# Patient Record
Sex: Female | Born: 1995 | Race: Black or African American | Hispanic: No | Marital: Married | Smoking: Never smoker
Health system: Southern US, Community
[De-identification: ages and names within clinical notes are randomized; demographics above are authoritative.]

## PROBLEM LIST (undated history)

## (undated) HISTORY — PX: EYE SURGERY: SHX253

---
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
## 2017-08-18 ENCOUNTER — Other Ambulatory Visit: Payer: Self-pay | Admitting: Internal Medicine

## 2017-08-25 ENCOUNTER — Other Ambulatory Visit: Payer: Self-pay

## 2017-09-02 ENCOUNTER — Ambulatory Visit
Admission: RE | Admit: 2017-09-02 | Discharge: 2017-09-02 | Disposition: A | Discharge status: Home / Self Care | Payer: BLUE CROSS/BLUE SHIELD | Source: Ambulatory Visit | Attending: Internal Medicine | Admitting: Internal Medicine

## 2019-04-16 ENCOUNTER — Ambulatory Visit (INDEPENDENT_AMBULATORY_CARE_PROVIDER_SITE_OTHER): Payer: Self-pay | Admitting: Family Medicine

## 2019-04-16 ENCOUNTER — Encounter: Payer: Self-pay | Admitting: Family Medicine

## 2019-04-16 ENCOUNTER — Other Ambulatory Visit: Payer: Self-pay

## 2019-04-16 VITALS — BP 106/64 | HR 67 | Ht 63.0 in | Wt 112.0 lb

## 2019-04-16 DIAGNOSIS — L689 Hypertrichosis, unspecified: Secondary | ICD-10-CM

## 2019-04-16 DIAGNOSIS — Z23 Encounter for immunization: Secondary | ICD-10-CM

## 2019-04-16 DIAGNOSIS — L819 Disorder of pigmentation, unspecified: Secondary | ICD-10-CM

## 2019-04-16 DIAGNOSIS — Z0000 Encounter for general adult medical examination without abnormal findings: Secondary | ICD-10-CM

## 2019-04-16 DIAGNOSIS — L678 Other hair color and hair shaft abnormalities: Secondary | ICD-10-CM

## 2019-04-16 NOTE — Assessment & Plan Note (Signed)
Patient reports history of acne with dark spots remaining after resolution of comedones. Patient requests help with removing these spots.  -recommended hydroquinone -handout with information provided

## 2019-04-16 NOTE — Assessment & Plan Note (Signed)
Patient with excessive

## 2019-04-16 NOTE — Patient Instructions (Addendum)
It was a pleasure meeting you today. I look forward to working with you as your primary care physician.   Today we discussed:   1. Unwanted facial hair/dark spots       - Recommend drying over the counter hydroquinine      - Vaniqa that helps to slow hair growth on the face.       - Also recommend trying the wax/sugaring treatment to help with quick removal if you choose.    Please follow up with me in 4-6 weeks to see how things are improving.   Best Wishes,   Dr. Sharol Harness     Hydroquinone skin cream, gel, emulsion, lotion, or solution What is this medicine? HYDROQUINONE (hahy droh [facility]) is applied to the the skin to lighten areas that have darkened. Some products also contain sunscreens. This medicine may be used for other purposes; ask your health care provider or pharmacist if you have questions. COMMON BRAND NAME(S): Aclaro, Aclaro PD, Alera, Alphaquin HP, Alustra, Claripel, Complex B, Dermarest Skin Correcting Cream Plus, Eldopaque Forte, Eldoquin Forte, [employer]	[place], [place], [place], [place] XM, [place], [place], [place], Melanex, Melpaque HP, Melquin HP, Melquin-3, Nava-Jansen, Nuquin HP, Skin Bleaching, Solaquin Forte What should I tell my health care provider before I take this medicine? They need to know if you have any of these conditions:  an unusual or allergic reaction to hydroquinone, sunscreens, other medicines, foods, dyes, or preservatives  pregnant or trying to get pregnant  breast-feeding How should I use this medicine? This medicine is for external use only. Do not take by mouth. Follow the directions on the prescription label. Wash your hands before and after applying. Make sure the skin is clean and dry. Apply just enough to cover the affected area. Rub in gently but completely. Do not apply near the eyes, mouth, or other areas of sensitive skin. If accidental contact occurs, large amounts of water should be used to wash the affected area. If the eyes  are involved and eye irritation persists after thoroughly washing, contact your doctor. If you are using other skin medicines, apply them at different times of the day. Do not use your medicine more often than directed. Talk to your pediatrician regarding the use of this medicine in children. Special care may be needed. Overdosage: If you think you have taken too much of this medicine contact a poison control center or emergency room at once. NOTE: This medicine is only for you. Do not share this medicine with others. What if I miss a dose? If you miss a dose, apply it as soon as you can. If it is almost time for your next dose, use only that dose. Do not use double or extra doses. What may interact with this medicine?  benzoyl peroxide This list may not describe all possible interactions. Give your health care provider a list of all the medicines, herbs, non-prescription drugs, or dietary supplements you use. Also tell them if you smoke, drink alcohol, or use illegal drugs. Some items may interact with your medicine.  Eflornithine skin cream (Vaniqa) What is this medicine? EFLORNITHINE (ee FLOR ni theen) is used on the skin to reduce unwanted facial hair in women. This medicine may be used for other purposes; ask your health care provider or pharmacist if you have questions. COMMON BRAND NAME(S): Vaniqa What should I tell my health care provider before I take this medicine? They need to know if you have any of these conditions:  an unusual or  allergic reaction to eflornithine, other medicines, foods, dyes, or preservatives  pregnant or trying to get pregnant  breast-feeding How should I use this medicine? This medicine is for external use only. Do not take by mouth. Follow the direction on the prescription label. Wash your hands before and after use. Apply a thin film of cream to the affected areas of the face and chin twice a day (at least 8 hours apart), or as often as directed by your  doctor or health care professional. Rub the cream in thoroughly. Do not wash the treatment area for at least 4 hours after application of the cream. Wait a few minutes after applying this cream before you apply cosmetics or sunscreens. This cream does not permanently remove unwanted facial hair. Continue to use your normal method for hair removal until desired results have been achieved. Do not use your medicine more often than directed. Talk to your pediatrician regarding the use of this medicine in children. Special care may be needed. While this drug may be prescribed for girls as young as 24 years of age for selected conditions, precautions do apply. Overdosage: If you think you have taken too much of this medicine contact a poison control center or emergency room at once. NOTE: This medicine is only for you. Do not share this medicine with others. What if I miss a dose? If you miss a dose, use it as soon as you can. If it is almost time for your next dose, use only that dose. Do not use double or extra doses. What may interact with this medicine? Interactions are not expected. Do not use any other skin products on the affected area without asking your doctor or health care professional. This list may not describe all possible interactions. Give your health care provider a list of all the medicines, herbs, non-prescription drugs, or dietary supplements you use. Also tell them if you smoke, drink alcohol, or use illegal drugs. Some items may interact with your medicine. What should I watch for while using this medicine? Improvement of the condition occurs gradually. It may take 4 to 8 weeks. If there is no improvement after 6 months, talk to your doctor about stopping this medicine. About 8 weeks after stopping treatment with this medicine, the hair will return to the same condition as before treatment. What side effects may I notice from receiving this medicine? Side effects that you should report to  your doctor or health care professional as soon as possible:  allergic reactions like skin rash, itching or hives, swelling of the face, lips, or tongue Side effects that usually do not require medical attention (report to your doctor or health care professional if they continue or are bothersome):  hair bumps  redness  stinging or burning This list may not describe all possible side effects. Call your doctor for medical advice about side effects. You may report side effects to FDA at 1-800-FDA-1088. Where should I keep my medicine? Keep out of the reach of children. Store at room temperature between 15 and 30 degrees C (59 and 86 degrees F). Do not freeze. Throw away any unused medicine after the expiration date. NOTE: This sheet is a summary. It may not cover all possible information. If you have questions about this medicine, talk to your doctor, pharmacist, or health care provider.  2020 Elsevier/Gold Standard (2007-05-18 17:12:20) What should I watch for while using this medicine? Contact your doctor or health care professional if your condition does not improve  in the first two months or if you experience too much skin irritation. This medicine will work best if you avoid excessive exposure to sunlight and wear sunscreens and protective clothing. Some hydroquinone products contain sunscreens. Use a sunscreen (SPF 15 or higher). Do not use sun lamps or sun tanning beds or booths. Do not apply to sunburned areas or if you have a skin wound in the area of application. Most cosmetics, sunscreens, and moisturizing lotions may be worn over this medicine. Wait several minutes after application of this medicine before applying them.  What side effects may I notice from receiving this medicine? Side effects that you should report to your doctor or health care professional as soon as possible:  severe burning, itching, crusting, or swelling of the treated areas  unusual skin  discoloration Side effects that usually do not require medical attention (report to your doctor or health care professional if they continue or are bothersome):  mild itching or stinging  reddening of the skin This list may not describe all possible side effects. Call your doctor for medical advice about side effects. You may report side effects to FDA at 1-800-FDA-1088. Where should I keep my medicine? Keep out of the reach of children. Store at room temperature between 15 and 30 degrees C (59 and 86 degrees F). Do not freeze. Throw away any unused medicine after the expiration date. NOTE: This sheet is a summary. It may not cover all possible information. If you have questions about this medicine, talk to your doctor, pharmacist, or health care provider.  2020 Elsevier/Gold Standard (2015-02-15 20:45:28)

## 2019-04-16 NOTE — Assessment & Plan Note (Signed)
Patient presents with excessive facial hair and normal menstruation patterns, no other signs consistent with hirsutism. 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  -counseled patient on using cosmetic procedures for hair removal including waxing, sugaring and laser hair removal  -offered Vaniqa RX

## 2019-04-16 NOTE — Assessment & Plan Note (Signed)
Discussed recommendation for healthcare maintenance items including TDap and HIV screening.  -HIV  -TDap given

## 2019-04-16 NOTE — Progress Notes (Signed)
    SUBJECTIVE:   CHIEF COMPLAINT / HPI: establishing care  Family Excessive facial hair growth Anna Cohen having excessive growth of hair on her face, lower jaw, and neck.  Patient states that she tries to shave these areas with a hair overgrowth that decreased.  Patient question about menses parents and his regular menstrual cycles and BMI within normal range.  Patient states that her mother also has a growth in a similar pattern.  She reports a history of sensitive skin and is hesitant to have waxing procedures done on her face but there is currently received vaccine treatment.  She denies any other areas on her body with excessive hair growth.  She denies any fevers areas of hair growth called any itching or pain or bleeding or purulent drainage.  Dark spots on her abdomen Patient also reports having dark spots on her abdomen.  These spots coincide with the areas that she needs to shave frequently for hair removal purposes.  She has since been using there but does not notice any change to the dark pigmentation.  Patient is requesting something to help with dark spots on her skin.  PHQ9 and GAD7 completed. Score of 0.   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 Rhythm is regular. No murmurs or rubs.   Pulm: Clear to auscultation bilaterally, no crackles, wheezing, or diminished breath sounds. Normal respiratory effort Abdomen: Bowel sounds normal. Abdomen flat, soft and non-tender.  Extremities: No peripheral edema. Warm/ well perfused.   Neuro: alert and oriented  Skin: dark hairs along mandibles bilaterally, dark hairs on neck, hyperpigmented follicles in area of lower abdomen   ASSESSMENT/PLAN:   Hypertrichosis Patient with excessive   Abnormal facial hair Patient presents with excessive facial hair and normal menstruation patterns, no other signs consistent with hirsutism. 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  -counseled patient on using cosmetic procedures for hair removal including waxing, sugaring and laser hair removal  -offered Vaniqa RX    Hyperpigmentation of skin Patient reports history of acne with dark spots remaining after resolution of comedones. Patient requests help with removing these spots.  -recommended hydroquinone -handout with information provided   Healthcare maintenance Discussed recommendation for healthcare maintenance items including TDap and HIV screening.  -HIV  -TDap given     03/26/2019, MD [facility]

## 2019-04-17 LAB — HIV ANTIBODY (ROUTINE TESTING W REFLEX): HIV Screen 4th Generation wRfx: NONREACTIVE

## 2019-04-19 ENCOUNTER — Encounter: Payer: Self-pay | Admitting: Family Medicine

## 2019-04-19 NOTE — Progress Notes (Signed)
HIV screening results included in letter to be sent to patient.

## 2020-04-13 NOTE — Progress Notes (Signed)
    SUBJECTIVE:   CHIEF COMPLAINT / HPI: physical   HM  Patient recommended for pap smear. She denies hx of abnormal pap smears in the past. She denies vaginal odor, pruritis. LMP was Feburary around the 14, has had to take Plan B in the last month.  She does not use any form of regular contraception. She does not desire to start contraception and she is planning to return to abstinence. She has noticed some yellow and thin discharge. Patient reports having unprotected intercourse once, she would like to have STI testing today.    OBJECTIVE:   BP 115/70   Pulse 82   Wt 114 lb 9.6 oz (52 kg)   SpO2 100%   BMI 20.30 kg/m   General: female appearing stated age in no acute distress Cardio: Normal S1 and S2, no S3 or S4. Rhythm is regular.  Pulm: Clear to auscultation bilaterally, no crackles, wheezing, or diminished breath sounds. Normal respiratory effort Abdomen: Bowel sounds normal. Abdomen soft and non-tender.   Genitalia:  Normal introitus for age, no external lesions, yellow/green watery vaginal discharge, mucosa pink and moist, no vaginal or cervical lesions, no vaginal atrophy, no friaility or hemorrhage, normal uterus size and position, no adnexal masses or tenderness   ASSESSMENT/PLAN:   Healthcare maintenance Pap smear completed today  Hep C screening and HIV screening completed  Abnormal vaginal discharge so completed wet prep  Patient prefers RPR, G/C testing as well     Ronnald Ramp, MD [facility]

## 2020-04-14 ENCOUNTER — Other Ambulatory Visit (HOSPITAL_COMMUNITY)
Admission: RE | Admit: 2020-04-14 | Discharge: 2020-04-14 | Disposition: A | Discharge status: Home / Self Care | Payer: 59 | Source: Ambulatory Visit | Attending: Family Medicine | Admitting: Family Medicine

## 2020-04-14 ENCOUNTER — Other Ambulatory Visit: Payer: Self-pay

## 2020-04-14 ENCOUNTER — Ambulatory Visit (INDEPENDENT_AMBULATORY_CARE_PROVIDER_SITE_OTHER): Payer: 59 | Admitting: Family Medicine

## 2020-04-14 ENCOUNTER — Encounter: Payer: Self-pay | Admitting: Family Medicine

## 2020-04-14 VITALS — BP 115/70 | HR 82 | Wt 114.6 lb

## 2020-04-14 DIAGNOSIS — Z0000 Encounter for general adult medical examination without abnormal findings: Secondary | ICD-10-CM | POA: Diagnosis not present

## 2020-04-14 DIAGNOSIS — Z124 Encounter for screening for malignant neoplasm of cervix: Secondary | ICD-10-CM | POA: Insufficient documentation

## 2020-04-14 DIAGNOSIS — Z7251 High risk heterosexual behavior: Secondary | ICD-10-CM | POA: Diagnosis not present

## 2020-04-14 LAB — POCT WET PREP (WET MOUNT)
Clue Cells Wet Prep Whiff POC: NEGATIVE
Trichomonas Wet Prep HPF POC: ABSENT

## 2020-04-14 NOTE — Patient Instructions (Signed)
I will follow up with you regarding the results of your test today once they are available.  For the swollen areas near your groin, I recommend applying a warm cloth to help reduce swelling.    Ingrown Hair  An ingrown hair is a hair that curls and re-enters the skin instead of growing straight out of the skin. An ingrown hair can develop in any part of the skin that hair is removed from. An ingrown hair may cause small pockets of infection. What are the causes? An ingrown hair may be caused by:  Shaving.  Tweezing.  Waxing.  Using a hair removal cream. What increases the risk? You are more likely to develop this condition if you have curly hair. What are the signs or symptoms? Symptoms of an ingrown hair may include:  Small bumps on the skin. The bumps may be filled with pus.  Pain.  Itching. How is this diagnosed? An ingrown hair is diagnosed by a skin exam. How is this treated? Treatment is often not needed unless the ingrown hair has caused an infection. If needed, treatment may include:  Applying prescription creams to the skin. This can help with inflammation.  Applying warm compresses to the skin. This can help soften the skin.  Taking antibiotic medicine. An antibiotic may be prescribed if the infection is severe.  Retracting and releasing the ingrown hair tips. Follow these instructions at home: Medicines  Take, apply, or use over-the-counter and prescription medicines only as told by your health care provider. This includes any prescription creams.  If you were prescribed an antibiotic medicine, take it as told by your health care provider. Do not stop using the antibiotic even if you start to feel better. General instructions  Do not shave irritated areas of skin. You may start shaving these areas again once the irritation has gone away.  To help remove ingrown hairs on your face, you may use a facial sponge in a gentle circular motion.  Do not pick or  squeeze the irritated area of skin as this may cause infection and scarring. Managing pain and swelling  If directed, apply heat to the affected area as often as told by your health care provider. Use the heat source that your health care provider recommends, such as a moist heat pack or a heating pad. ? Place a towel between your skin and the heat source. ? Leave the heat on for 20-30 minutes. ? Remove the heat if your skin turns bright red. This is especially important if you are unable to feel pain, heat, or cold. You may have a greater risk of getting burned.   How is this prevented?  Shower before shaving.  Wrap areas that you are going to shave in warm, moist wraps for several minutes before shaving. The warmth and moisture help to soften the hairs and makes ingrown hairs less likely.  Use thick shaving gels.  Use a razor that cuts hair slightly above your skin, or use an electric shaver with a long shave setting.  Shave in the direction of hair growth.  Avoid making multiple razor strokes.  Apply a moisturizing lotion after shaving. Summary  An ingrown hair is a hair that curls and re-enters the skin instead of growing straight out of the skin.  Treatment is often not needed unless the ingrown hair has caused an infection.  Take, apply, or use over-the-counter and prescription medicines only as told by your health care provider. This includes any prescription creams.  Do not shave irritated areas of skin. You may start shaving these areas again once the irritation has gone away.  If directed, apply heat to the affected area. Use the heat source that your health care provider recommends, such as a moist heat pack or a heating pad. This information is not intended to replace advice given to you by your health care provider. Make sure you discuss any questions you have with your health care provider. Document Revised: 07/23/2017 Document Reviewed: 07/23/2017 Elsevier Patient  Education  2021 [employer].

## 2020-04-15 LAB — HIV ANTIBODY (ROUTINE TESTING W REFLEX): HIV Screen 4th Generation wRfx: NONREACTIVE

## 2020-04-15 LAB — RPR: RPR Ser Ql: NONREACTIVE

## 2020-04-15 LAB — HEPATITIS C ANTIBODY: Hep C Virus Ab: <0.1 s/co ratio (ref 0.0–0.9)

## 2020-04-16 NOTE — Assessment & Plan Note (Addendum)
Pap smear completed today  Hep C screening and HIV screening completed  Abnormal vaginal discharge so completed wet prep  Patient prefers RPR, G/C testing as well

## 2020-04-17 LAB — CYTOLOGY - PAP
Chlamydia: NEGATIVE
Comment: NEGATIVE
Comment: NORMAL
Diagnosis: NEGATIVE
Neisseria Gonorrhea: NEGATIVE

## 2020-07-31 IMAGING — US US ABDOMEN COMPLETE
1 series · 14 of 25 positions shown · non-contrast
Comparison: None in PACs

CLINICAL DATA: Elevated bilirubin.  Occasional back pain.

EXAM:
ABDOMEN ULTRASOUND COMPLETE

[Series 1: us abdomen complete · 0.11mm/px · 14 of 71 slices shown]
[im 1/71]
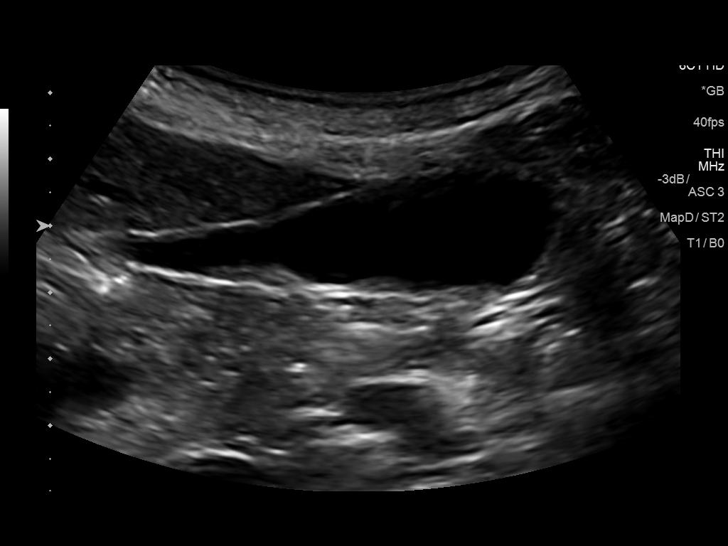
[im 6/71]
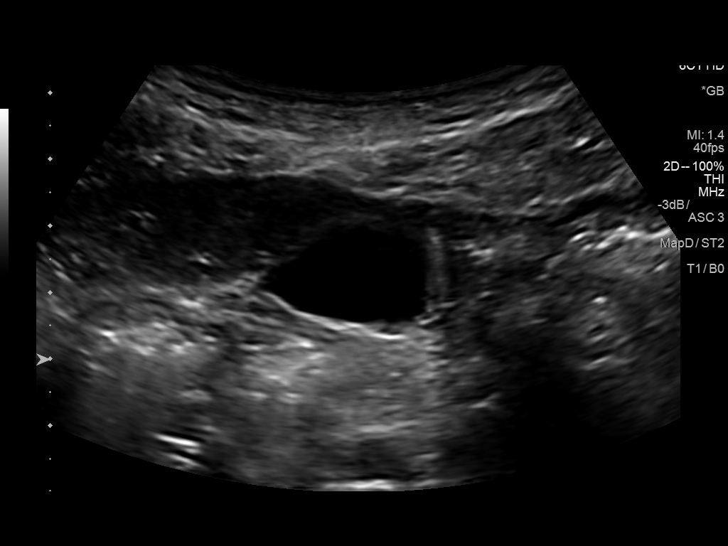
[im 12/71]
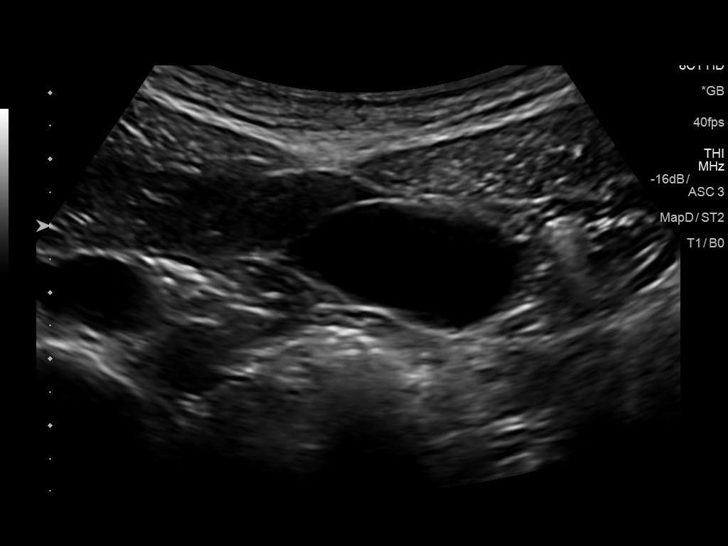
[im 18/71]
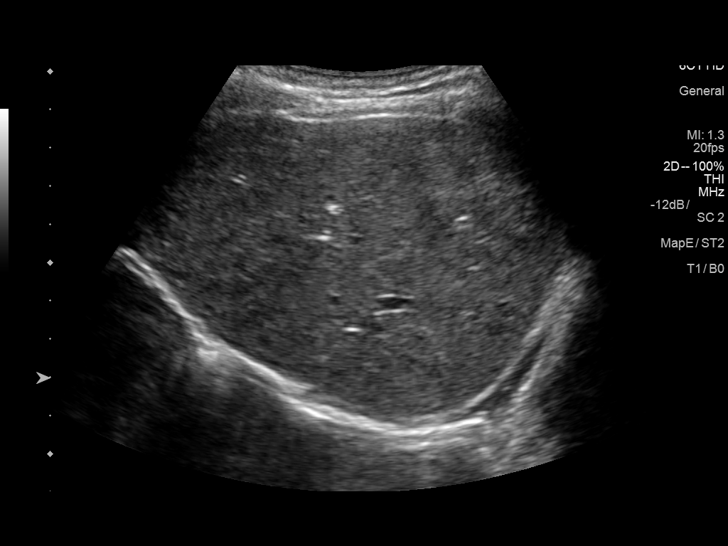
[im 24/71]
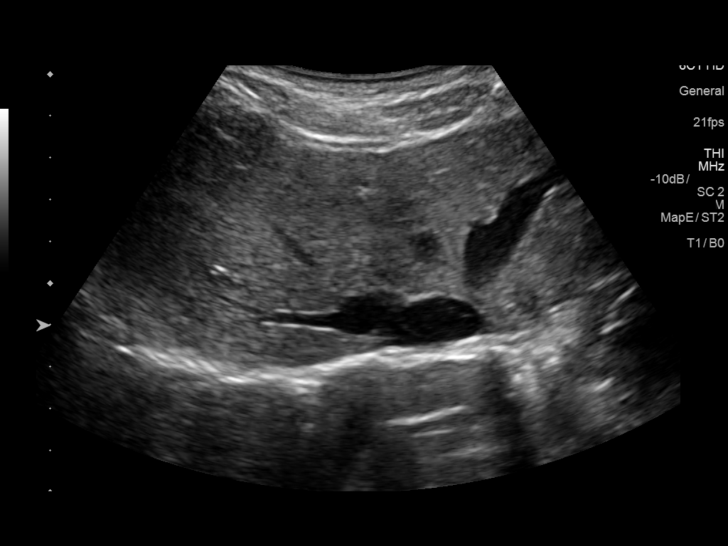
[im 27/71]
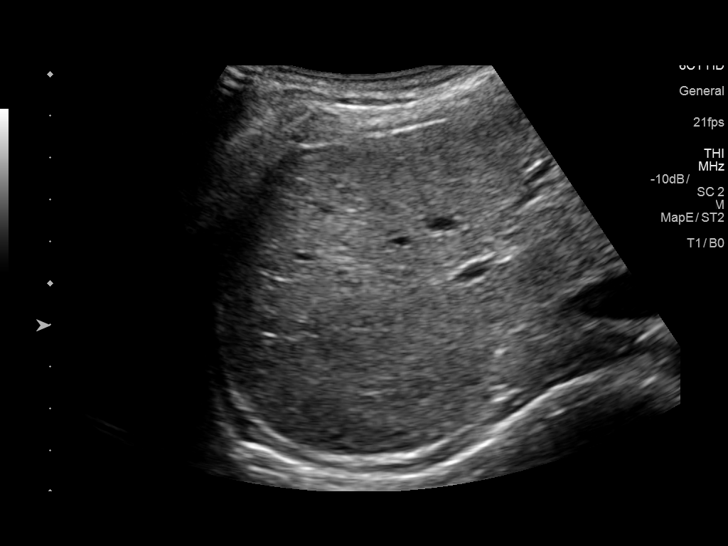
[im 33/71]
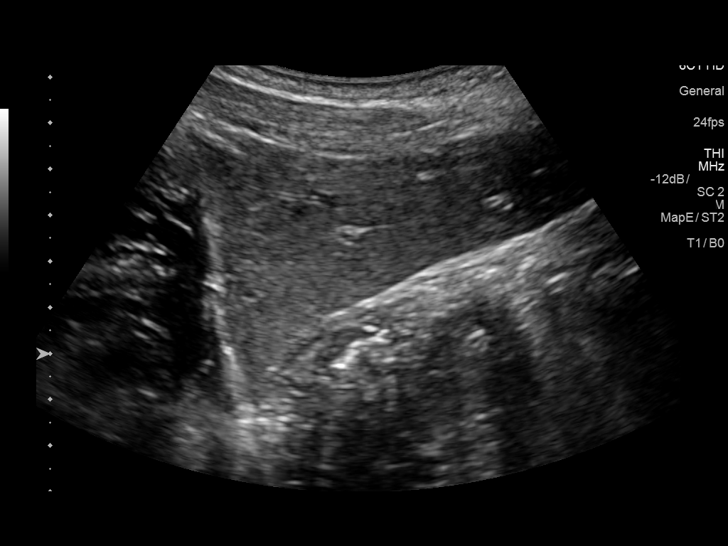
[im 38/71]
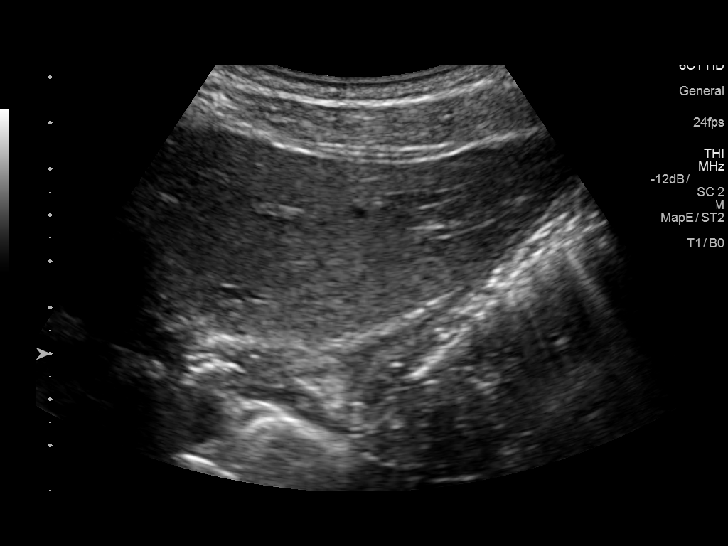
[im 44/71]
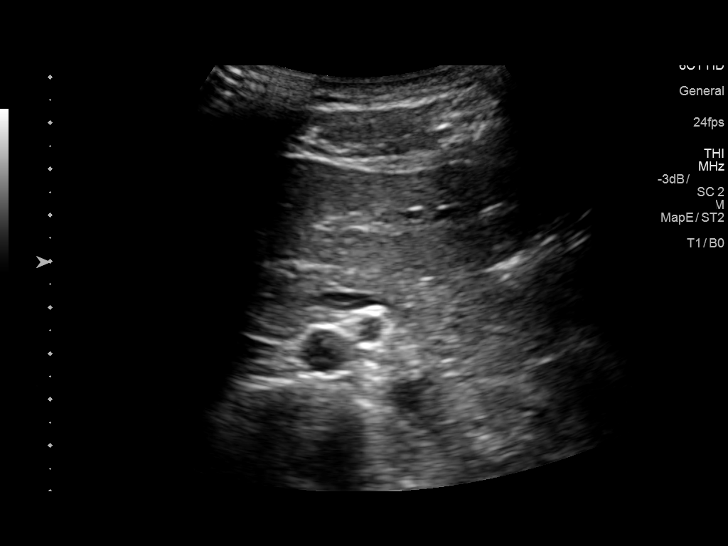
[im 47/71]
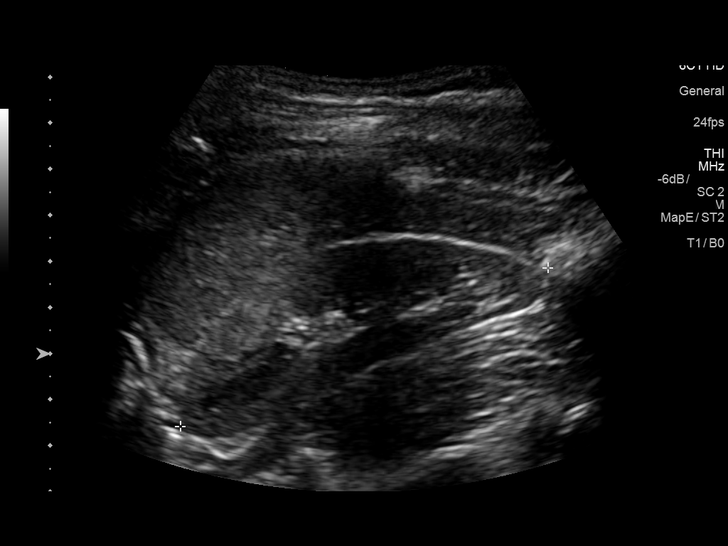
[im 53/71]
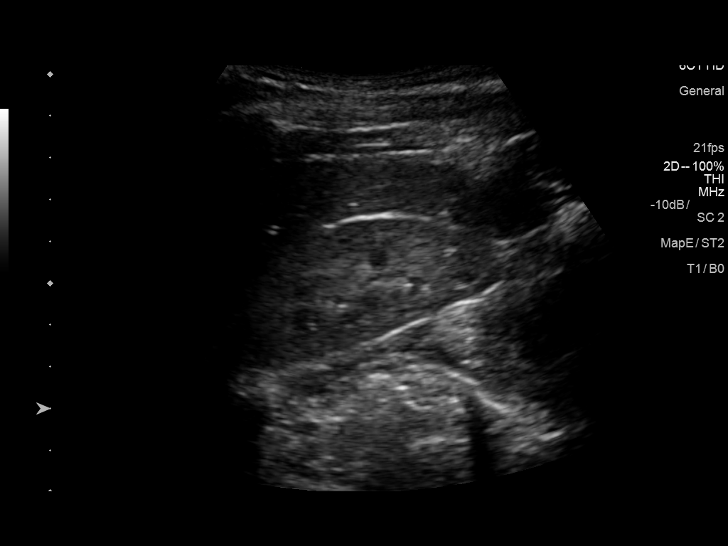
[im 59/71]
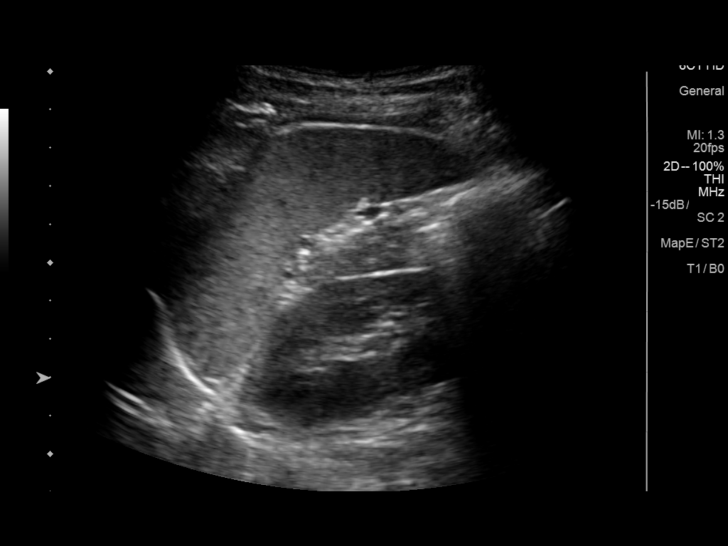
[im 65/71]
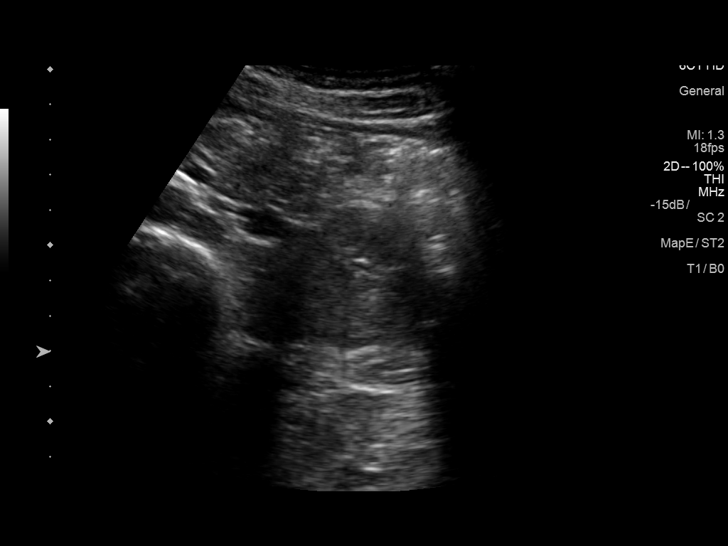
[im 71/71]
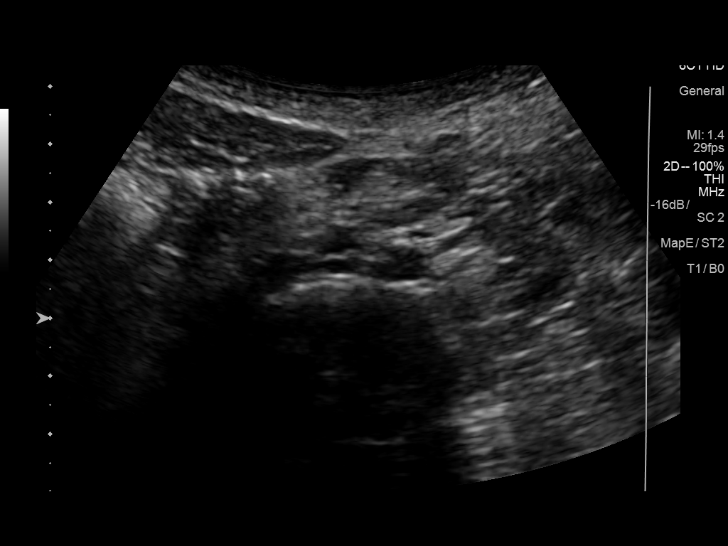

[14 of 25 positions shown; findings below may reference images not displayed]

FINDINGS: Gallbladder: No gallstones or wall thickening visualized. No
sonographic Murphy sign noted by sonographer.

Common bile duct: Diameter: 4 mm

Liver: No focal lesion identified. Within normal limits in
parenchymal echogenicity. Portal vein is patent on color Doppler
imaging with normal direction of blood flow towards the liver.

IVC: No abnormality visualized.

Pancreas: Visualized portion unremarkable.

Spleen: Size and appearance within normal limits.

Right Kidney: Length: 8.7 cm. The cortical echotexture is increased.
There is no focal mass nor hydronephrosis.

Left Kidney: Length: 8.4 cm. The cortical echotexture is increased.
There is no focal mass nor hydronephrosis.

Abdominal aorta: No aneurysm visualized.

Other findings: None.
IMPRESSION: Increased renal cortical echotexture compatible with medical renal
disease. No hydronephrosis.

Normal appearance of the gallbladder, common bile duct, and liver.

## 2020-12-13 ENCOUNTER — Other Ambulatory Visit: Payer: Self-pay

## 2020-12-13 ENCOUNTER — Other Ambulatory Visit: Payer: Self-pay | Admitting: Family Medicine

## 2020-12-13 ENCOUNTER — Encounter: Payer: Self-pay | Admitting: Family Medicine

## 2020-12-13 ENCOUNTER — Ambulatory Visit: Payer: 59 | Admitting: Family Medicine

## 2020-12-13 VITALS — BP 132/67 | HR 60 | Wt 119.0 lb

## 2020-12-13 DIAGNOSIS — Z7184 Encounter for health counseling related to travel: Secondary | ICD-10-CM | POA: Diagnosis not present

## 2020-12-13 DIAGNOSIS — Z23 Encounter for immunization: Secondary | ICD-10-CM | POA: Diagnosis not present

## 2020-12-13 DIAGNOSIS — Z298 Encounter for other specified prophylactic measures: Secondary | ICD-10-CM

## 2020-12-13 DIAGNOSIS — R1032 Left lower quadrant pain: Secondary | ICD-10-CM

## 2020-12-13 LAB — POCT UA - MICROSCOPIC ONLY

## 2020-12-13 LAB — POCT URINALYSIS DIP (MANUAL ENTRY)
Bilirubin, UA: NEGATIVE
Glucose, UA: NEGATIVE mg/dL
Ketones, POC UA: NEGATIVE mg/dL
Nitrite, UA: NEGATIVE
Protein Ur, POC: NEGATIVE mg/dL
Spec Grav, UA: 1.015 (ref 1.010–1.025)
Urobilinogen, UA: 0.2 E.U./dL
pH, UA: 7 (ref 5.0–8.0)

## 2020-12-13 LAB — POCT URINE PREGNANCY: Preg Test, Ur: NEGATIVE

## 2020-12-13 MED ORDER — TYPHOID VACCINE PO CPDR: 1.0000 | DELAYED_RELEASE_CAPSULE | ORAL | 0 refills | Status: DC

## 2020-12-13 MED ORDER — ATOVAQUONE-PROGUANIL HCL 250-100 MG PO TABS: 1.0000 | ORAL_TABLET | Freq: Every day | ORAL | 0 refills | Status: DC

## 2020-12-13 MED ORDER — CEPHALEXIN 500 MG PO CAPS: 500.0000 mg | ORAL_CAPSULE | Freq: Two times a day (BID) | ORAL | 0 refills | Status: AC

## 2020-12-13 NOTE — Patient Instructions (Addendum)
For your left lower quadrant pain, you do have some signs of a possible UTI, I am sending in a medication called Keflex which you will take twice daily for 5 days.  I am also sending out for a culture and if that comes back negative we can stop the antibiotics.  I am sending in for a pelvic ultrasound as well to make sure it is not related to a cyst or an issue with your ovary.  For your travel prophylaxis medications they go as follows: - Malarone (Atovaquone-Proguanil), which you take 1 to 2 days prior to leaving, once daily during your entire stay and once daily for 7 days after you come back. - Vivotif, this is an oral typhoid vaccine which she will take every other day for total of 4 doses. (Take once you are done with your antibiotics) - You will also need to get yellow fever vaccination and that will have to be done at the health department       Typhoid Vaccine, Live oral enteric-coated capsules What is this medication? TYPHOID ORAL VACCINE (TYE foid vax EEN) is used to prevent typhoid infection. The vaccine is recommended if you travel to parts of the world where typhoid is common. This medicine may be used for other purposes; ask your health care provider or pharmacist if you have questions. COMMON BRAND NAME(S): Vivotif ([employer]ax[employer] Vivotif Berna What should I tell my care team before I take this medication? They need to know if you have any of these conditions: active infection with fever cancer HIV or AIDS immune system problems low blood counts, like low white cell, platelet, or red cell counts recent or ongoing radiation therapy stomach or intestine problems an unusual or allergic reaction to vaccines, yeast, other medicines, foods, dyes, or preservatives pregnant or trying to get pregnant breast-feeding How should I use this medication? Take this medicine by mouth with a glass of water. Take this medicine on an empty stomach, at least 1 hour before food. Do not take  with food. Do not cut, crush or chew this medicine. A copy of Vaccine Information Statements will be given before each vaccination. Read this sheet carefully each time. The sheet may change frequently. Talk to your pediatrician regarding the use of this medicine in children. While this drug may be prescribed for children as young as 6 years for selected conditions, precautions do apply. Overdosage: If you think you have taken too much of this medicine contact a poison control center or emergency room at once. NOTE: This medicine is only for you. Do not share this medicine with others. What if I miss a dose? It is important not to miss your dose. Call your doctor or health care professional if you miss a dose. Four doses, given 2 days apart, are needed for protection. The last dose should be given at least 1 week before travel to allow the vaccine time to work. What may interact with this medication? Do not take this medicine with any of the following medications: certain antibiotics like sulfamethoxazole (SMZ) or other sulfonamides This medicine may also interact with the following medications: antimalarial drugs immune globulins medicines for organ transplant medicines to treat cancer other vaccines some medicines for arthritis steroid medicines like prednisone or cortisone This list may not describe all possible interactions. Give your health care provider a list of all the medicines, herbs, non-prescription drugs, or dietary supplements you use. Also tell them if you smoke, drink alcohol, or use illegal drugs.  Some items may interact with your medicine. What should I watch for while using this medication? This vaccine, like all vaccines, may not fully protect everyone. Report any side effects that are worrisome to your doctor right away. What side effects may I notice from receiving this medication? Side effects that you should report to your doctor or health care professional as soon as  possible: allergic reactions like skin rash, itching or hives, swelling of the face, lips, or tongue Side effects that usually do not require medical attention (report to your doctor or health care professional if they continue or are bothersome): diarrhea fever headache nausea, vomiting stomach pain This list may not describe all possible side effects. Call your doctor for medical advice about side effects. You may report side effects to FDA at 1-800-FDA-1088. Where should I keep my medication? Keep out of the reach of children. Store refrigerated between 2 and 8 degrees C (35.6 and 46.4 degrees F). Do not freeze. Keep this vaccine in the original container. NOTE: This sheet is a summary. It may not cover all possible information. If you have questions about this medicine, talk to your doctor, pharmacist, or health care provider.  2022 Elsevier/Gold Standard (2016-06-07 00:00:00)     Atovaquone; Proguanil tablets What is this medication? ATOVAQUONE; PROGUANIL (a TOE va kwone; pro GWAN il) is an antimalarial agent. It is used to prevent and to treat malaria infections. This medicine may be used for other purposes; ask your health care provider or pharmacist if you have questions. COMMON BRAND NAME(S): Malarone, Malarone Pediatric What should I tell my care team before I take this medication? They need to know if you have any of these conditions: kidney disease liver disease stomach problems an unusual or allergic reaction to atovaquone, proguanil, other medicines, foods, dyes, or preservatives pregnant or trying to get pregnant breast-feeding How should I use this medication? Take this medicine by mouth with a glass of water. Follow the directions on the prescription label. Take this medicine at the same time each day with food or a milky drink. The tablets may be crushed and mixed with condensed milk just before the dose. If you vomit within 1 hour after taking your dose, take your  dose again. Take all of your medicine as directed even if you think you are better. Do not skip doses or stop your medicine early. To prevent malaria, take this medicine daily starting 1 or 2 days before entering the area, and continue for 7 days after leaving. Talk to your pediatrician regarding the use of this medicine in children. While this drug may be prescribed for children for selected conditions, precautions do apply. Overdosage: If you think you have taken too much of this medicine contact a poison control center or emergency room at once. NOTE: This medicine is only for you. Do not share this medicine with others. What if I miss a dose? If you miss a dose, take it as soon as you can. If it is almost time for your next dose, take only that dose. Do not take double or extra doses. What may interact with this medication? metoclopramide rifabutin rifampin tetracycline This list may not describe all possible interactions. Give your health care provider a list of all the medicines, herbs, non-prescription drugs, or dietary supplements you use. Also tell them if you smoke, drink alcohol, or use illegal drugs. Some items may interact with your medicine. What should I watch for while using this medication? If you get a  fever during or after you are in a malaria-endemic area, call your doctor. Tell your doctor that you may have been exposed to malaria. This medicine can make you more sensitive to the sun. Keep out of the sun. If you cannot avoid being in the sun, wear protective clothing and use sunscreen. Do not use sun lamps or tanning beds/booths. While in areas where malaria is common, take steps to prevent mosquito bites. Stay in air-conditioned or well-screened rooms to reduce human-mosquito contact. Sleep under mosquito netting, preferably one with pyrethrum-containing insecticide. Wear long-sleeved shirts or blouses and long trousers to protect arms and legs. Apply mosquito repellents  containing DEET to uncovered areas of skin. Use a pyrethrum-containing flying insect spray to kill mosquitoes. What side effects may I notice from receiving this medication? Side effects that you should report to your doctor or health care professional as soon as possible: allergic reactions like skin rash, itching or hives, swelling of the face, lips, or tongue breathing problems changes in vision fever or infection redness, blistering, peeling or loosening of the skin, including inside the mouth unusually weak or tired Side effects that usually do not require medical attention (report to your doctor or health care professional if they continue or are bothersome): cough diarrhea dizziness headache loss of appetite nausea, vomiting stomach pain trouble sleeping This list may not describe all possible side effects. Call your doctor for medical advice about side effects. You may report side effects to FDA at 1-800-FDA-1088. Where should I keep my medication? Keep out of the reach of children. Store at room temperature between 15 and 30 degrees C (59 and 86 degrees F). Throw away any unused medicine after the expiration date. NOTE: This sheet is a summary. It may not cover all possible information. If you have questions about this medicine, talk to your doctor, pharmacist, or health care provider.  2022 Elsevier/Gold Standard (2020-02-11 00:00:00)

## 2020-12-13 NOTE — Progress Notes (Signed)
    SUBJECTIVE:   CHIEF COMPLAINT / HPI:   LLQ pain Patient reports that she has had LLQ pain for the last 6 days, she is not having any dysuria but does note that she has some pressure in her left lower quadrant when she is walking and when her bladder is full and she has the urge to urinate.  She has not any fevers or low back pain.  She is having normal bowel movements, does state that she had some harder stools about 2 days ago but not for constipation.  Last menstrual cycle was on October 22, she has not had intercourse since February and was STD tested afterwards.  Her periods typically last 5 days, are painful for the first several days and are not excessively heavy.  International travel Patient and her mother are reporting they are going to [place] on December 15 and staying for about 3 weeks.  They are wanting to have malarial prophylaxis and any other medications that are required.   PERTINENT  PMH / PSH: Reviewed  OBJECTIVE:   Wt 119 lb (54 kg)   LMP 11/18/2020 (Exact Date)   BMI 21.08 kg/m   Gen: well-appearing, NAD CV: RRR, no m/r/g appreciated, no peripheral edema Pulm: CTAB, no wheezes/crackles GI: soft, mildly tender in LLQ with a bit of fullness in the area, no rebound tenderness, non-distended  ASSESSMENT/PLAN:   LLQ Pain Patient with 6 days of intermittent LLQ pain/pressure, has not taken any medications. Not sexually active and declined STD testing. UA showed some trace blood and leukocytes, will send for culture and treat for UTI with Keflex BID x5 days. Consideration for ovarian pathology such as a cyst, will obtain a pelvic US, which was scheduled in the clinic. Consideration for an ovarian torsion but not likely with the intermittent pressure sensation, and this would be too far out for intervention.  International Travel Patient travelling to [place] in December for 3 weeks.  CDC guidelines were reviewed and the following medications were sent to  her pharmacy. - Malarone for antimalarial prophylaxis, which will be taken daily for 1 to 2 days before, every day while she is overseas in for 7 days after she comes back. - Typhoid vaccine, oral every other day x4 doses.  Patient instructed to start this after her antibiotic course. - Also needing yellow fever vaccination, she has obtained previously but unsure if it is still good.  We do not offer here and patient was instructed to call the health department.   Evelena Leyden, DO [facility]

## 2020-12-16 LAB — URINE CULTURE: Organism ID, Bacteria: NO GROWTH

## 2020-12-25 ENCOUNTER — Ambulatory Visit
Admission: RE | Admit: 2020-12-25 | Discharge: 2020-12-25 | Disposition: A | Discharge status: Home / Self Care | Payer: 59 | Source: Ambulatory Visit | Attending: Family Medicine | Admitting: Family Medicine

## 2020-12-25 ENCOUNTER — Other Ambulatory Visit: Payer: Self-pay

## 2020-12-25 DIAGNOSIS — R1032 Left lower quadrant pain: Secondary | ICD-10-CM

## 2021-01-05 ENCOUNTER — Telehealth: Payer: Self-pay

## 2021-01-05 MED ORDER — ATOVAQUONE-PROGUANIL HCL 250-100 MG PO TABS: 1.0000 | ORAL_TABLET | Freq: Every day | ORAL | 0 refills | Status: DC

## 2021-01-05 MED ORDER — TYPHOID VACCINE PO CPDR: 1.0000 | DELAYED_RELEASE_CAPSULE | ORAL | 0 refills | Status: DC

## 2021-01-05 NOTE — Addendum Note (Signed)
Addended by: Steva Colder on: 01/05/2021 02:47 PM   Modules accepted: Orders

## 2021-01-05 NOTE — Telephone Encounter (Addendum)
Patient calls nurse line reporting difficulty obtaining travel medications at [facility].   Patient reports medications are too expensive there. Patient reports she reached out to different pharmacies and [facility] has the best price.   Will resend medications to [facility].   Prescriptions have been canceled at [facility].

## 2021-02-03 ENCOUNTER — Other Ambulatory Visit: Payer: Self-pay

## 2021-02-03 ENCOUNTER — Encounter (HOSPITAL_COMMUNITY): Payer: Self-pay | Admitting: *Deleted

## 2021-02-03 ENCOUNTER — Ambulatory Visit (HOSPITAL_COMMUNITY)
Admission: EM | Admit: 2021-02-03 | Discharge: 2021-02-03 | Disposition: A | Discharge status: Home / Self Care | Payer: 59 | Attending: Physician Assistant | Admitting: Physician Assistant

## 2021-02-03 DIAGNOSIS — J069 Acute upper respiratory infection, unspecified: Secondary | ICD-10-CM | POA: Diagnosis present

## 2021-02-03 DIAGNOSIS — Z1152 Encounter for screening for COVID-19: Secondary | ICD-10-CM | POA: Diagnosis not present

## 2021-02-03 LAB — POC INFLUENZA A AND B ANTIGEN (URGENT CARE ONLY)
INFLUENZA A ANTIGEN, POC: NEGATIVE
INFLUENZA B ANTIGEN, POC: NEGATIVE

## 2021-02-03 NOTE — ED Provider Notes (Signed)
[facility]    CSN: [id number] Arrival date & time: 02/03/21  1527      History   Chief Complaint Chief Complaint  Patient presents with   Cough   Diarrhea    HPI Anna Cohen is a 26 y.o. female.   Patient here today for evaluation of cough, congestion, fever that started 2 days ago.  She states she has had a temperature up to 102.4.  Symptoms started right after she returned from [place].  She did have diarrhea that started today as well.  She has not had any nausea or vomiting.  She denies any sore throat.  She states she did have similar illness a week ago that cleared.  She has been using over-the-counter medications with mild relief.  The history is provided by the patient.   History reviewed. No pertinent past medical history.  Patient Active Problem List   Diagnosis Date Noted   Healthcare maintenance 04/16/2019   Abnormal facial hair 04/16/2019   Hyperpigmentation of skin 04/16/2019    Past Surgical History:  Procedure Laterality Date   EYE SURGERY      OB History   No obstetric history on file.      Home Medications    Prior to Admission medications   Medication Sig Start Date End Date Taking? Authorizing Provider  atovaquone-proguanil (MALARONE) 250-100 MG TABS tablet Take 1 tablet by mouth daily. Once daily 1 to 2 days prior to entering endemic area, during stay, and for 7 days after return 01/05/21  Yes Lilland, Alana, DO  typhoid (VIVOTIF) DR capsule Take 1 capsule by mouth every other day. 1 hour before a meal with a cold or luke-warm drink (not to exceed body temperature) on days 1, 3, 5, and 7, for total of 4 doses; complete at least 1 week prior to potential exposure 01/05/21  Yes Lilland, Alana, DO    Family History Family History  Problem Relation Age of Onset   Healthy Mother    Healthy Father     Social History Social History   Tobacco Use   Smoking status: Never   Smokeless tobacco: Never  Vaping Use   Vaping  Use: Never used  Substance Use Topics   Alcohol use: No   Drug use: Never     Allergies   Patient has no known allergies.   Review of Systems Review of Systems  Constitutional:  Negative for chills and fever.  HENT:  Positive for congestion, sinus pressure and sore throat. Negative for ear pain.   Eyes:  Negative for discharge and redness.  Respiratory:  Positive for cough. Negative for shortness of breath and wheezing.   Gastrointestinal:  Negative for abdominal pain, diarrhea, nausea and vomiting.    Physical Exam Triage Vital Signs ED Triage Vitals  Enc Vitals Group     BP      Pulse      Resp      Temp      Temp src      SpO2      Weight      Height      Head Circumference      Peak Flow      Pain Score      Pain Loc      Pain Edu?      Excl. in GC?    No data found.  Updated Vital Signs BP 123/74    Pulse 97    Temp 98.9 F (37.2  C) (Oral)    Resp 18    LMP 01/18/2021 (Exact Date)    SpO2 100%      Physical Exam Vitals and nursing note reviewed.  Constitutional:      General: She is not in acute distress.    Appearance: Normal appearance. She is not ill-appearing.  HENT:     Head: Normocephalic and atraumatic.     Right Ear: Tympanic membrane normal.     Left Ear: Tympanic membrane normal.     Nose: Congestion present.     Mouth/Throat:     Mouth: Mucous membranes are moist.     Pharynx: No oropharyngeal exudate or posterior oropharyngeal erythema.  Eyes:     Conjunctiva/sclera: Conjunctivae normal.  Cardiovascular:     Rate and Rhythm: Normal rate and regular rhythm.     Heart sounds: Normal heart sounds. No murmur heard. Pulmonary:     Effort: Pulmonary effort is normal. No respiratory distress.     Breath sounds: Normal breath sounds. No wheezing, rhonchi or rales.  Skin:    General: Skin is warm and dry.  Neurological:     Mental Status: She is alert.  Psychiatric:        Mood and Affect: Mood normal.        Thought Content: Thought  content normal.     UC Treatments / Results  Labs (all labs ordered are listed, but only abnormal results are displayed) Labs Reviewed  SARS CORONAVIRUS 2 (TAT 6-24 HRS)  POC INFLUENZA A AND B ANTIGEN (URGENT CARE ONLY)    EKG   Radiology No results found.  Procedures Procedures (including critical care time)  Medications Ordered in UC Medications - No data to display  Initial Impression / Assessment and Plan / UC Course  I have reviewed the triage vital signs and the nursing notes.  Pertinent labs & imaging results that were available during my care of the patient were reviewed by me and considered in my medical decision making (see chart for details).    Suspect viral etiology of symptoms. Covid screening ordered. Recommend symptomatic treatment. Follow up with any further concerns.   Final Clinical Impressions(s) / UC Diagnoses   Final diagnoses:  Viral upper respiratory tract infection  Encounter for screening for COVID-19   Discharge Instructions   None    ED Prescriptions   None    PDMP not reviewed this encounter.   Tomi Bamberger, PA-C 02/04/21 [phone]

## 2021-02-03 NOTE — ED Triage Notes (Signed)
C/O cough and congestion onset 12/31 w/ fevers, then improved. Returned 2 days ago from vacation from [place]. Started back with worsening cough and congestion and fevers up to 102.4, starting 2 days ago. Diarrhea onset yesterday.

## 2021-02-04 LAB — SARS CORONAVIRUS 2 (TAT 6-24 HRS): SARS Coronavirus 2: NEGATIVE

## 2021-02-07 ENCOUNTER — Other Ambulatory Visit: Payer: Self-pay

## 2021-02-07 ENCOUNTER — Ambulatory Visit (HOSPITAL_COMMUNITY)
Admission: EM | Admit: 2021-02-07 | Discharge: 2021-02-07 | Disposition: A | Discharge status: Home / Self Care | Payer: 59 | Attending: Emergency Medicine | Admitting: Emergency Medicine

## 2021-02-07 ENCOUNTER — Encounter (HOSPITAL_COMMUNITY): Payer: Self-pay | Admitting: Emergency Medicine

## 2021-02-07 DIAGNOSIS — J069 Acute upper respiratory infection, unspecified: Secondary | ICD-10-CM

## 2021-02-07 DIAGNOSIS — R197 Diarrhea, unspecified: Secondary | ICD-10-CM | POA: Diagnosis not present

## 2021-02-07 LAB — POCT URINALYSIS DIPSTICK, ED / UC
Bilirubin Urine: NEGATIVE
Glucose, UA: NEGATIVE mg/dL
Nitrite: NEGATIVE
Protein, ur: NEGATIVE mg/dL
Specific Gravity, Urine: 1.02 (ref 1.005–1.030)
Urobilinogen, UA: 0.2 mg/dL (ref 0.0–1.0)
pH: 6 (ref 5.0–8.0)

## 2021-02-07 LAB — POC URINE PREG, ED: Preg Test, Ur: NEGATIVE

## 2021-02-07 MED ORDER — AMOXICILLIN-POT CLAVULANATE 875-125 MG PO TABS: 1.0000 | ORAL_TABLET | Freq: Two times a day (BID) | ORAL | 0 refills | Status: DC

## 2021-02-07 NOTE — ED Provider Notes (Signed)
[facility]    CSN: [id number] Arrival date & time: 02/07/21  1141      History   Chief Complaint Chief Complaint  Patient presents with   Diarrhea   Emesis   Vaginal Bleeding    HPI Anna Cohen is a 26 y.o. female. Pt reports traveling to [place] 01/13/21. Returned to [place] 01/31/21.  Took Malarone during trip, finishing Malarone soon.   Developed sore throat 01/27/21, took amoxicllin for a few days, sore throat initially felt better, then returned along with nasal congestion, cough, fever. Last had fever 1/723, none since. Feels is getting worse as is now coughing up yellow sputum and blowing yellow nasal drainage from her nose. Reports post-nasal drainage. Denies SOB, wheezing. Cough and congestion worse at night. Feels dayquil and nyquil are not helping.   Has had diarrhea since 01/31/21.  Denies n/v.  Has not altered diet (ex had pork chop, broccoli, macaroni and cheese yesterday). Is having loose stools, not liquid, 3-4x per day after she eats. No diarrhea today as she has not eaten yet today. Denies abd pain except has abd cramping after eating and before diarrhea episode. Denies blood or mucus in stool. Denies increased gas, bloating.   Yesterday developed mild vaginal spotting with bright red blood. Is using panty liner. LMP 01/18/21. Took Plan B to prevent pregnancy ~01/25/21.   Pt seen at urgent care 1/7 for same - covid and influenza tests negative at that visit.      Diarrhea Associated symptoms: fever   Associated symptoms: no abdominal pain, no chills and no vomiting   Emesis Associated symptoms: cough, diarrhea and fever   Associated symptoms: no abdominal pain, no chills and no sore throat   Vaginal Bleeding Associated symptoms: fever   Associated symptoms: no abdominal pain, no dysuria, no nausea and no vaginal discharge    History reviewed. No pertinent past medical history.  Patient Active Problem List   Diagnosis Date Noted   Healthcare  maintenance 04/16/2019   Abnormal facial hair 04/16/2019   Hyperpigmentation of skin 04/16/2019    Past Surgical History:  Procedure Laterality Date   EYE SURGERY      OB History   No obstetric history on file.      Home Medications    Prior to Admission medications   Medication Sig Start Date End Date Taking? Authorizing Provider  amoxicillin-clavulanate (AUGMENTIN) 875-125 MG tablet Take 1 tablet by mouth every 12 (twelve) hours. 02/07/21  Yes Carvel Getting, NP  atovaquone-proguanil (MALARONE) 250-100 MG TABS tablet Take 1 tablet by mouth daily. Once daily 1 to 2 days prior to entering endemic area, during stay, and for 7 days after return 01/05/21   Lilland, Alana, DO  typhoid (VIVOTIF) DR capsule Take 1 capsule by mouth every other day. 1 hour before a meal with a cold or luke-warm drink (not to exceed body temperature) on days 1, 3, 5, and 7, for total of 4 doses; complete at least 1 week prior to potential exposure 01/05/21   Lilland, Lorrin Goodell, DO    Family History Family History  Problem Relation Age of Onset   Healthy Mother    Healthy Father     Social History Social History   Tobacco Use   Smoking status: Never   Smokeless tobacco: Never  Vaping Use   Vaping Use: Never used  Substance Use Topics   Alcohol use: No   Drug use: Never     Allergies   Patient  has no known allergies.   Review of Systems Review of Systems  Constitutional:  Positive for fever. Negative for appetite change and chills.  HENT:  Positive for congestion and postnasal drip. Negative for sinus pressure and sore throat.   Respiratory:  Positive for cough. Negative for shortness of breath and wheezing.   Gastrointestinal:  Positive for diarrhea. Negative for abdominal pain, nausea and vomiting.  Genitourinary:  Positive for vaginal bleeding. Negative for dysuria, hematuria and vaginal discharge.    Physical Exam Triage Vital Signs ED Triage Vitals  Enc Vitals Group     BP 02/07/21  1219 119/77     Pulse Rate 02/07/21 1219 70     Resp 02/07/21 1219 16     Temp 02/07/21 1219 98.6 F (37 C)     Temp Source 02/07/21 1219 Oral     SpO2 02/07/21 1219 99 %     Weight --      Height --      Head Circumference --      Peak Flow --      Pain Score 02/07/21 1353 8     Pain Loc --      Pain Edu? --      Excl. in GC? --    No data found.  Updated Vital Signs BP 119/77 (BP Location: Right Arm)    Pulse 70    Temp 98.6 F (37 C) (Oral)    Resp 16    LMP 01/18/2021 (Exact Date)    SpO2 99%   Visual Acuity Right Eye Distance:   Left Eye Distance:   Bilateral Distance:    Right Eye Near:   Left Eye Near:    Bilateral Near:     Physical Exam Constitutional:      General: She is not in acute distress.    Appearance: Normal appearance. She is ill-appearing.  HENT:     Right Ear: Tympanic membrane, ear canal and external ear normal.     Left Ear: Tympanic membrane, ear canal and external ear normal.     Nose: Congestion and rhinorrhea present.     Mouth/Throat:     Mouth: Mucous membranes are moist.     Pharynx: Oropharynx is clear.  Cardiovascular:     Rate and Rhythm: Normal rate and regular rhythm.  Pulmonary:     Effort: Pulmonary effort is normal.     Breath sounds: Normal breath sounds.  Abdominal:     General: Abdomen is flat. Bowel sounds are normal.     Palpations: Abdomen is soft.     Tenderness: There is no abdominal tenderness.  Neurological:     Mental Status: She is alert.     UC Treatments / Results  Labs (all labs ordered are listed, but only abnormal results are displayed) Labs Reviewed  POCT URINALYSIS DIPSTICK, ED / UC - Abnormal; Notable for the following components:      Result Value   Ketones, ur TRACE (*)    Hgb urine dipstick MODERATE (*)    Leukocytes,Ua TRACE (*)    All other components within normal limits  GASTROINTESTINAL PANEL BY PCR, STOOL (REPLACES STOOL CULTURE)  POC URINE PREG, ED    EKG   Radiology No  results found.  Procedures Procedures (including critical care time)  Medications Ordered in UC Medications - No data to display  Initial Impression / Assessment and Plan / UC Course  I have reviewed the triage vital signs and the nursing notes.  Pertinent  labs & imaging results that were available during my care of the patient were reviewed by me and considered in my medical decision making (see chart for details).    Discussed yellow nasal drainage - pt may have brewing sinus infection but I am hesitant to give her antibiotics given diarrhea. Pt to work on aggressive nasal saline rinse with Neti pot or saline nasal spray and add mucinex. If symptoms worsen/persist, may get augmentin rx filled. She understands the risk of worsening her diarrhea/causing C. Diff.   Pt to alter diet for diarrhea - given handout. If diarrhea persists through 02/11/21, pt to collect stool sample and bring in for testing. She likely has traveler's diarrhea.   UA shows mod hgb, urine preg is negative. I am not worried about vaginal spotting at this time. Pt f/u with pcp if spotting persists, usual period does not occur.   Final Clinical Impressions(s) / UC Diagnoses   Final diagnoses:  Diarrhea of presumed infectious origin  Viral upper respiratory tract infection     Discharge Instructions      Use saline nasal spray several times a day or saline irrigation such as with a Neti pot.  If you make sure old saline for Neti pot, use distilled water (not tap water) and either kosher salt or sea salt to make it (not table salt because it has iodine in it). The recipe is 1 cup warm distilled water mixed with 1/2 teaspoon salt. You can use a neti pot/saline irrigation several times a day as needed to help your nasal congestion drain.   Use Mucinex (generic guaifenesin is okay to use) to help thin your congestion and make it easier to drain. Drink lots of liquids to stay hydrated.   I have prescribed antibiotics  for a sinus infection but I advise you try to manage your congestion/mucus at home using saline irrigation and Mucinex before picking up the antibiotics as antibiotics will make your diarrhea worse.   Eat only simple, bland foods for the next few days to help relieve your diarrhea.   If your diarrhea continues through the weekend, please bring in a stool sample (using the supplies we give you).    ED Prescriptions     Medication Sig Dispense Auth. Provider   amoxicillin-clavulanate (AUGMENTIN) 875-125 MG tablet Take 1 tablet by mouth every 12 (twelve) hours. 14 tablet Carvel Getting, NP      PDMP not reviewed this encounter.   Carvel Getting, NP 02/07/21 1432

## 2021-02-07 NOTE — ED Triage Notes (Signed)
Pt reports was seen here recently and still having n/v/d.abd pains still ongoing when eats.  reports now having congestion and vaginal spotting that started yesterday. LMP 01/18/21. Blood is pinkish-reddish, wearing panty liner. Denies getting any medications when seen here.

## 2021-02-07 NOTE — Discharge Instructions (Addendum)
Use saline nasal spray several times a day or saline irrigation such as with a Neti pot.  If you make sure old saline for Neti pot, use distilled water (not tap water) and either kosher salt or sea salt to make it (not table salt because it has iodine in it). The recipe is 1 cup warm distilled water mixed with 1/2 teaspoon salt. You can use a neti pot/saline irrigation several times a day as needed to help your nasal congestion drain.   Use Mucinex (generic guaifenesin is okay to use) to help thin your congestion and make it easier to drain. Drink lots of liquids to stay hydrated.   I have prescribed antibiotics for a sinus infection but I advise you try to manage your congestion/mucus at home using saline irrigation and Mucinex before picking up the antibiotics as antibiotics will make your diarrhea worse.   Eat only simple, bland foods for the next few days to help relieve your diarrhea.   If your diarrhea continues through the weekend, please bring in a stool sample (using the supplies we give you).

## 2021-06-08 ENCOUNTER — Telehealth: Payer: Self-pay | Admitting: Family Medicine

## 2021-06-08 NOTE — Telephone Encounter (Signed)
Patient dropped off form at front desk for [facility].  Verified that patient section of form has been completed.  Last DOS/WCC with PCP was 12/13/20.  Placed form in red team folder to be completed by clinical staff.  Anna Cohen  ?

## 2021-06-08 NOTE — Telephone Encounter (Signed)
Reviewed form and placed in PCP's box for completion.  .Giavanna Kang R Porche Steinberger, CMA  

## 2021-06-12 NOTE — Telephone Encounter (Signed)
Form completed and placed in front office side pocket for patient pick up.  ? ?Eulis Foster, MD ?[place], PGY-3 ?[phone]  ? ?

## 2021-06-13 NOTE — Telephone Encounter (Signed)
Form placed up front for pick up.  ? ?Copy made for batch scanning.  ? ?VM left informing patient.  ?

## 2021-06-26 ENCOUNTER — Telehealth: Payer: 59

## 2021-06-26 ENCOUNTER — Ambulatory Visit (HOSPITAL_COMMUNITY)
Admission: EM | Admit: 2021-06-26 | Discharge: 2021-06-26 | Disposition: A | Discharge status: Home / Self Care | Payer: 59

## 2021-06-26 ENCOUNTER — Encounter (HOSPITAL_COMMUNITY): Payer: Self-pay | Admitting: Emergency Medicine

## 2021-06-26 DIAGNOSIS — M25562 Pain in left knee: Secondary | ICD-10-CM

## 2021-06-26 NOTE — ED Triage Notes (Signed)
Pt reports right eye irritation after getting "intimate body wash" in eye. States this occurred around 6 am and eye is still burning.  Pt also reports left knee pain since last Tuesday. 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 States have been using ACE wrap and muscle rub for relief.

## 2021-06-26 NOTE — ED Provider Notes (Signed)
[facility]    CSN: [id number] Arrival date & time: 06/26/21  2876     History   Chief Complaint Chief Complaint  Patient presents with   Eye Irritation    Knee Pain    HPI Anna Cohen is a 26 y.o. female.  Presents after getting soap in her eye.  States she rinsed and flushed it out after this occurred.  Has some minor irritation of the right eye, no redness itching or pain.  No drainage.  Has not tried anything for symptoms.  She does wear contacts. 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  She had some pain and swelling when the incident occurred but was able to walk.  She tried a couple ibuprofen the day of the incident but has not used any medicine since.  She has been applying Ace wrap bandage.  Has tried ice on and off for the first couple days.  States symptoms are much improved but she still has some minor pain in the lateral knee. Denies fever, chills, abdominal pain, vomiting/diarrhea, back pain, weakness, numbness/tingling.  She has full range of motion of the left leg.   History reviewed. No pertinent past medical history.  Patient Active Problem List   Diagnosis Date Noted   Healthcare maintenance 04/16/2019   Abnormal facial hair 04/16/2019   Hyperpigmentation of skin 04/16/2019    Past Surgical History:  Procedure Laterality Date   EYE SURGERY      OB History   No obstetric history on file.      Home Medications    Prior to Admission medications   Medication Sig Start Date End Date Taking? Authorizing Provider  amoxicillin-clavulanate (AUGMENTIN) 875-125 MG tablet Take 1 tablet by mouth every 12 (twelve) hours. 02/07/21   Cathlyn Parsons, NP  atovaquone-proguanil (MALARONE) 250-100 MG TABS tablet Take 1 tablet by mouth daily. Once daily 1 to 2 days prior to entering endemic area, during stay, and for 7 days after return 01/05/21   Lilland, Alana, DO  typhoid (VIVOTIF) DR capsule Take 1 capsule by mouth every  other day. 1 hour before a meal with a cold or luke-warm drink (not to exceed body temperature) on days 1, 3, 5, and 7, for total of 4 doses; complete at least 1 week prior to potential exposure 01/05/21   Lilland, Percival Spanish, DO    Family History Family History  Problem Relation Age of Onset   Healthy Mother    Healthy Father     Social History Social History   Tobacco Use   Smoking status: Never   Smokeless tobacco: Never  Vaping Use   Vaping Use: Never used  Substance Use Topics   Alcohol use: No   Drug use: Never     Allergies   Patient has no known allergies.   Review of Systems Review of Systems As per HPI  Physical Exam Triage Vital Signs ED Triage Vitals  Enc Vitals Group     BP 06/26/21 0947 109/71     Pulse Rate 06/26/21 0947 60     Resp 06/26/21 0947 16     Temp 06/26/21 0947 (!) 97.4 F (36.3 C)     Temp Source 06/26/21 0947 Oral     SpO2 06/26/21 0947 100 %     Weight --      Height --      Head Circumference --      Peak Flow --  Pain Score 06/26/21 0946 5     Pain Loc --      Pain Edu? --      Excl. in GC? --    No data found.  Updated Vital Signs BP 109/71 (BP Location: Left Arm)   Pulse 60   Temp (!) 97.4 F (36.3 C) (Oral)   Resp 16   SpO2 100%     Physical Exam Vitals and nursing note reviewed.  Constitutional:      General: She is not in acute distress. HENT:     Nose: Nose normal.     Mouth/Throat:     Mouth: Mucous membranes are moist.     Pharynx: Oropharynx is clear.  Eyes:     General: Lids are normal. Vision grossly intact.        Right eye: No foreign body or discharge.     Extraocular Movements: Extraocular movements intact.     Conjunctiva/sclera: Conjunctivae normal.     Pupils: Pupils are equal, round, and reactive to light.  Cardiovascular:     Rate and Rhythm: Normal rate and regular rhythm.     Heart sounds: Normal heart sounds.  Pulmonary:     Effort: Pulmonary effort is normal.     Breath sounds:  Normal breath sounds.  Abdominal:     Palpations: Abdomen is soft.     Tenderness: There is no abdominal tenderness.  Musculoskeletal:        General: No swelling.     Left knee: No swelling, deformity, effusion, bony tenderness or crepitus. Normal range of motion. No tenderness. No medial joint line or lateral joint line tenderness. Normal patellar mobility.     Comments: Full ROM left hip, knee, ankle. No tenderness to palpation. Strength 5/5. DP and PT pulses intact. Sensation intact. No back bony or muscular tenderness. Full ROM of back with no pain.  Neurological:     Mental Status: She is alert and oriented to person, place, and time.     Sensory: Sensation is intact.     Motor: Motor function is intact. No weakness.     Coordination: Coordination is intact.     Gait: Gait is intact. Gait normal.     Comments: Strength 5/5 lower extremities. Pulses, sensation intact.     UC Treatments / Results  Labs (all labs ordered are listed, but only abnormal results are displayed) Labs Reviewed - No data to display  EKG  Radiology No results found.  Procedures Procedures (including critical care time)  Medications Ordered in UC Medications - No data to display  Initial Impression / Assessment and Plan / UC Course  I have reviewed the triage vital signs and the nursing notes.  Pertinent labs & imaging results that were available during my care of the patient were reviewed by me and considered in my medical decision making (see chart for details).   Eye exam normal, patient states she feels much better.  I recommend she use normal saline eyedrops today just to rinse the eye and keep it moist. Physical exam is otherwise unremarkable. I recommend using ibuprofen every 4-6 hours for inflammation and pain.  She can continue using the Ace wrap bandage.  I recommend she elevate the extremity and try to avoid any heavy lifting, bending or squatting.  She can use ice to the area.  We discussed  that if she does not have improvement of symptoms she can go to walk-in Ortho clinic.  Patient agrees with plan. She is  discharged in stable condition.   Final Clinical Impressions(s) / UC Diagnoses   Final diagnoses:  Acute pain of left knee     Discharge Instructions      I recommend using ibuprofen every 4-6 hours for the next week.  This will help with inflammation and pain in the knee.  You can also continue to use your Ace bandage, I recommend elevating the leg and applying ice as needed.  If you feel your symptoms have not improved at the end of the week I recommend you go to Ortho clinic.  You can use regular saline drops in the eye to keep it moist.  If you develop more irritation, severe redness or pain, change in vision, please go to the emergency department.    ED Prescriptions   None    PDMP not reviewed this encounter.   Saanvika Vazques, Lurena Joiner, [place] 06/26/21 1053

## 2021-06-26 NOTE — Discharge Instructions (Addendum)
I recommend using ibuprofen every 4-6 hours for the next week.  This will help with inflammation and pain in the knee.  You can also continue to use your Ace bandage, I recommend elevating the leg and applying ice as needed.  If you feel your symptoms have not improved at the end of the week I recommend you go to Ortho clinic.  You can use regular saline drops in the eye to keep it moist.  If you develop more irritation, severe redness or pain, change in vision, please go to the emergency department.

## 2021-07-03 ENCOUNTER — Encounter: Payer: Self-pay | Admitting: *Deleted

## 2021-12-15 IMAGING — US US PELVIS COMPLETE WITH TRANSVAGINAL
1 series · 13 of 25 positions shown · non-contrast
Comparison: None

CLINICAL DATA: Initial evaluation for acute left lower quadrant
pain.



[Series 1: us pelvis complete with transvaginal · 0.15mm/px · 13 of 76 slices shown]
[im 1/76]
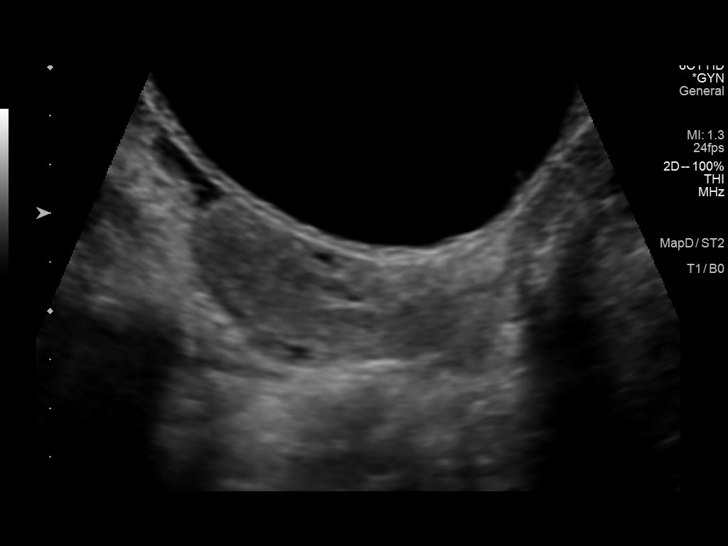
[im 7/76]
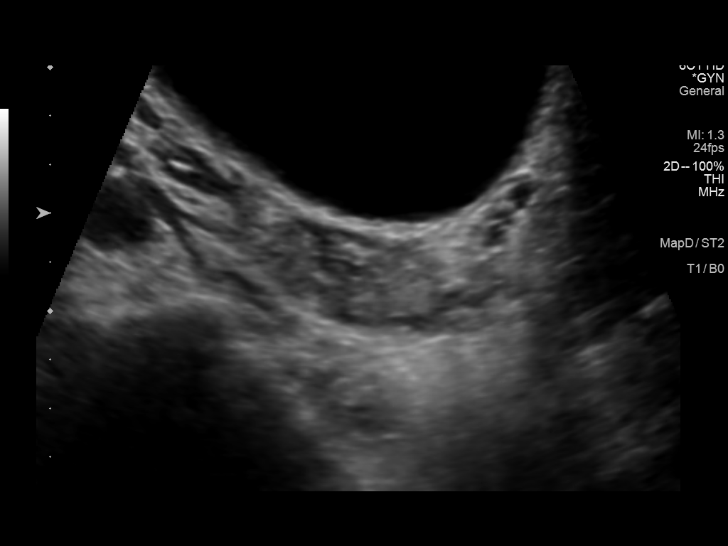
[im 13/76]
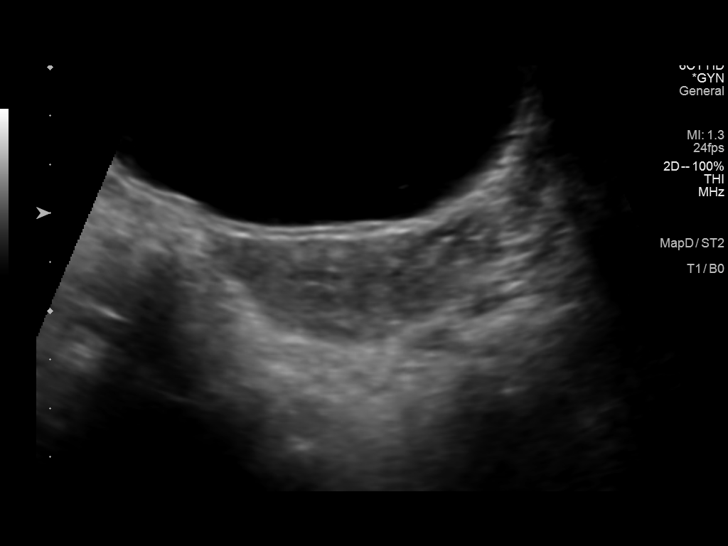
[im 19/76]
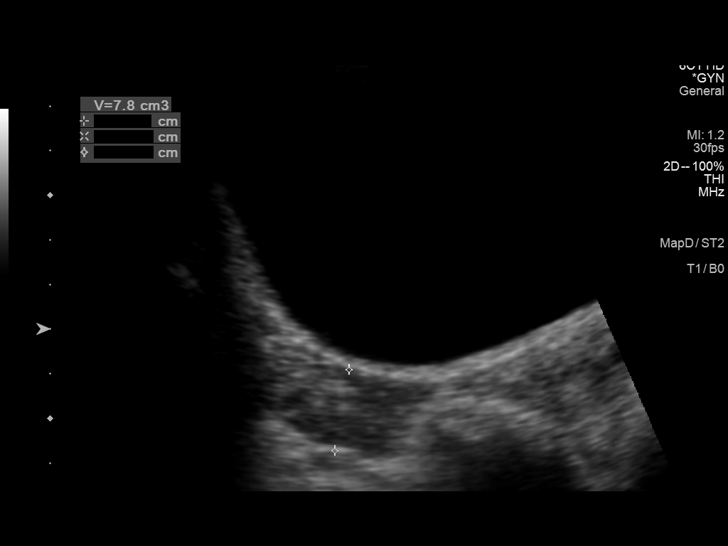
[im 26/76]
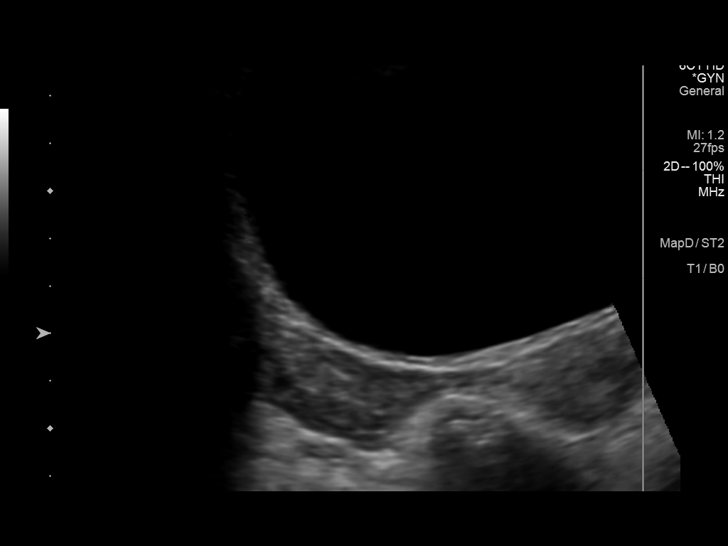
[im 32/76]
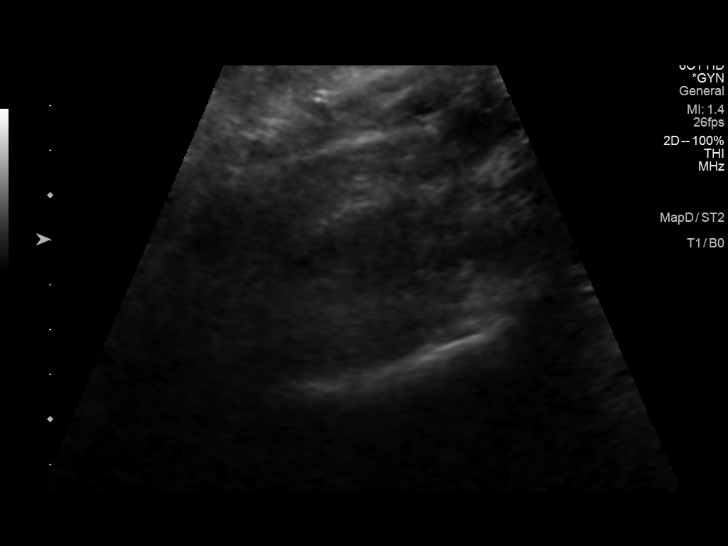
[im 38/76]
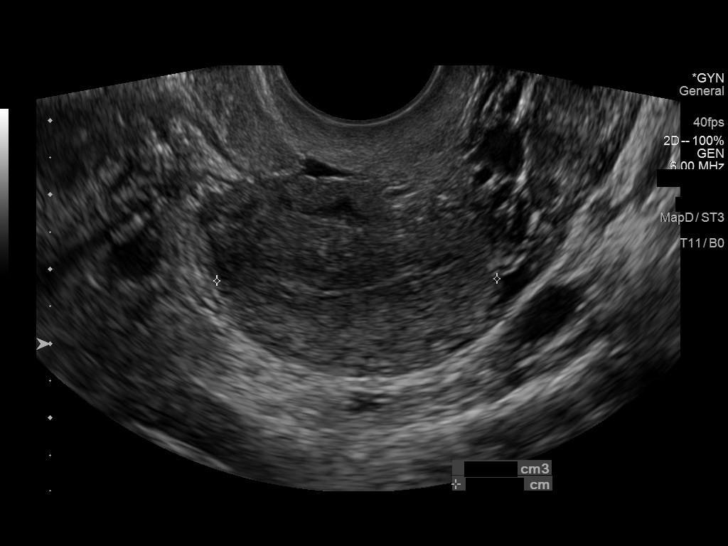
[im 44/76]
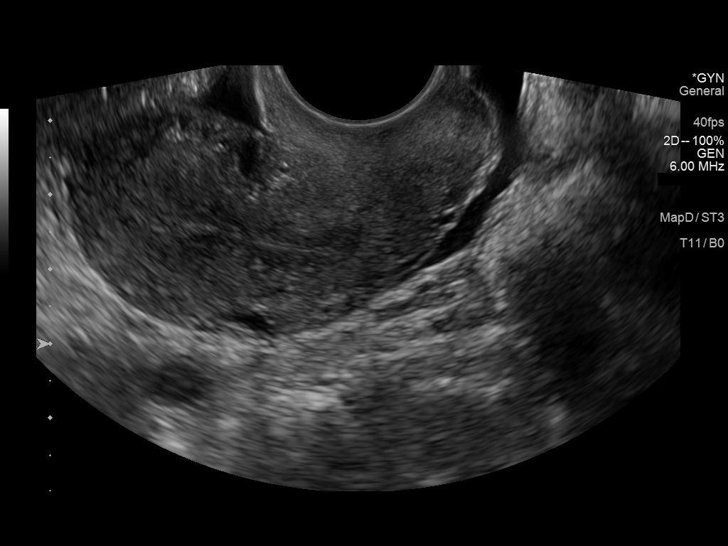
[im 51/76]
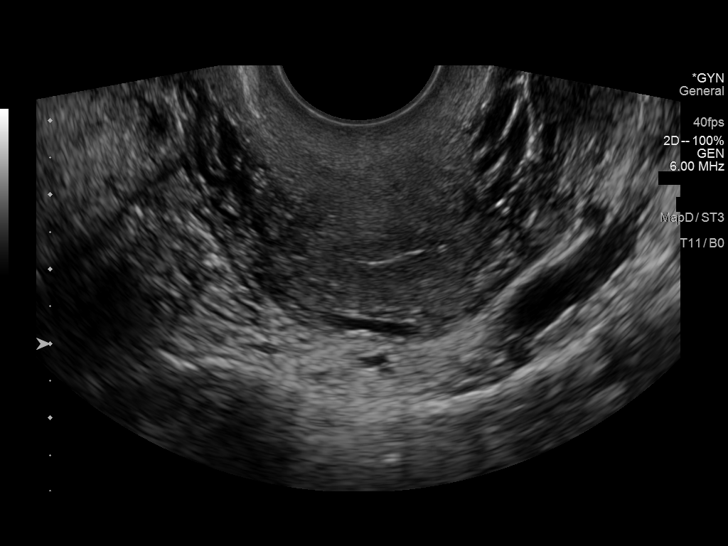
[im 57/76]
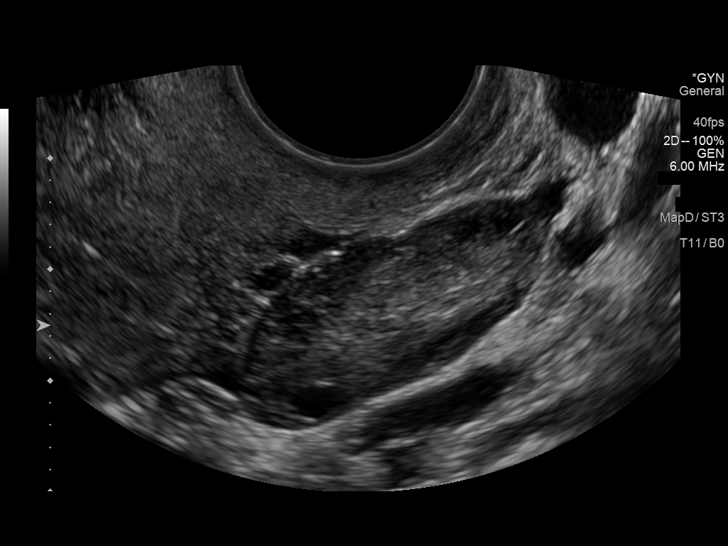
[im 63/76]
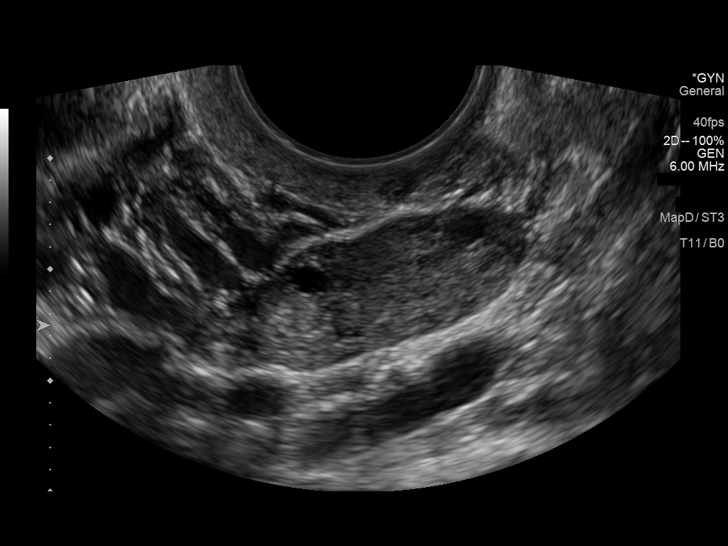
[im 69/76]
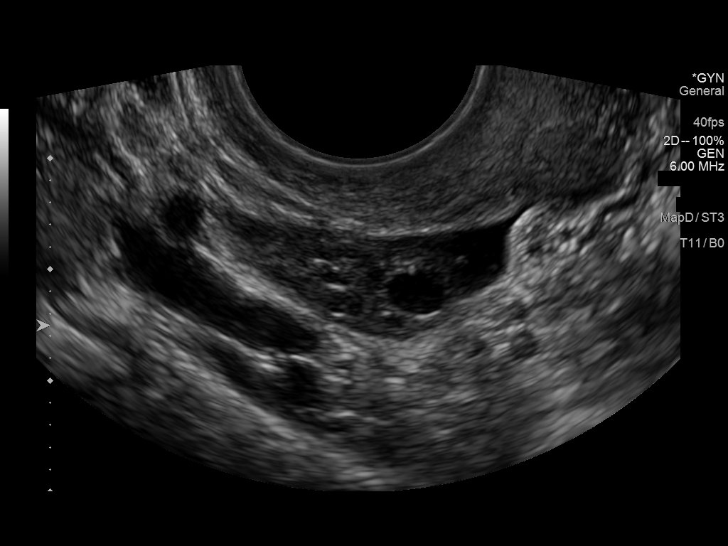
[im 76/76]
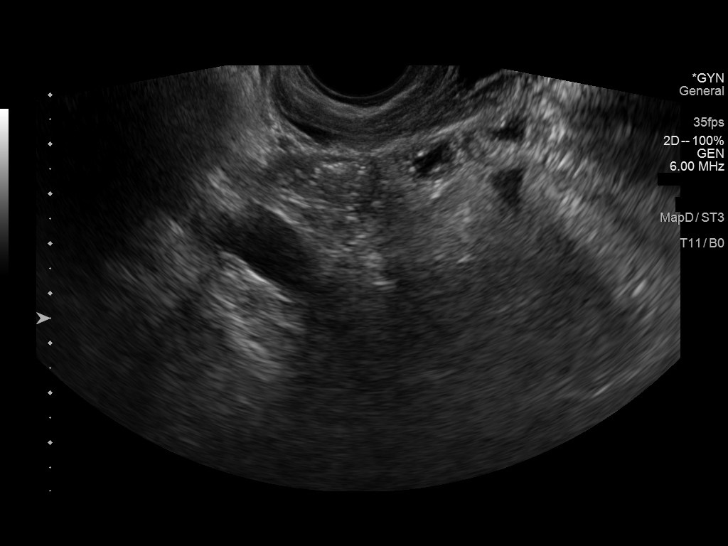

[13 of 25 positions shown; findings below may reference images not displayed]

FINDINGS: Uterus

Measurements: 5.3 x 2.6 x 3.8 cm = volume: 27.4 mL. Uterus is
anteverted. No discrete fibroid or other mass.

Endometrium

Thickness: 1.9 mm.  No focal abnormality visualized.

Right ovary

Measurements: 3.8 x 2.1 x 1.8 cm = volume: 7.8 mL. Normal
appearance/no adnexal mass.

Left ovary

Measurements: 2.7 x 1.4 x 1.2 cm = volume: 2.2 mL. Normal
appearance/no adnexal mass.

Other findings

Small volume free fluid within the pelvis, presumably physiologic.
IMPRESSION: 1. Small volume free fluid within the pelvis, presumably
physiologic.
2. Otherwise unremarkable and normal pelvic ultrasound. No findings
to explain patient's symptoms identified.

## 2021-12-29 ENCOUNTER — Encounter (HOSPITAL_COMMUNITY): Payer: Self-pay | Admitting: Emergency Medicine

## 2021-12-29 ENCOUNTER — Other Ambulatory Visit: Payer: Self-pay

## 2021-12-29 ENCOUNTER — Ambulatory Visit (HOSPITAL_COMMUNITY)
Admission: EM | Admit: 2021-12-29 | Discharge: 2021-12-29 | Disposition: A | Discharge status: Home / Self Care | Payer: 59 | Attending: Physician Assistant | Admitting: Physician Assistant

## 2021-12-29 DIAGNOSIS — J208 Acute bronchitis due to other specified organisms: Secondary | ICD-10-CM

## 2021-12-29 MED ORDER — PREDNISONE 10 MG (21) PO TBPK: ORAL_TABLET | ORAL | 0 refills | Status: DC

## 2021-12-29 MED ORDER — PROMETHAZINE-DM 6.25-15 MG/5ML PO SYRP: 5.0000 mL | ORAL_SOLUTION | Freq: Two times a day (BID) | ORAL | 0 refills | Status: DC | PRN

## 2021-12-29 MED ORDER — ALBUTEROL SULFATE HFA 108 (90 BASE) MCG/ACT IN AERS: 1.0000 | INHALATION_SPRAY | Freq: Four times a day (QID) | RESPIRATORY_TRACT | 0 refills | Status: DC | PRN

## 2021-12-29 NOTE — ED Provider Notes (Signed)
[facility]    CSN: [id number] Arrival date & time: 12/29/21  1524      History   Chief Complaint Chief Complaint  Patient presents with   Cough    HPI Anna Cohen is a 26 y.o. female.   Patient presents today with 4-day history of URI symptoms.  She reports cough, congestion, body aches, fever.  Denies any chest pain, shortness of breath, nausea, vomiting, diarrhea.  She did take an at-home COVID test that was negative.  She has had her COVID vaccines.  Had COVID several years ago.  Denies any known sick contacts.  She has tried multiple over-the-counter medications including DayQuil and NyQuil without improvement of symptoms.  She is confident that she is not pregnant.  Denies any recent antibiotics or steroids.  Denies personal history of asthma, allergies, COPD, smoking.  She has missed work as a result of symptoms.    History reviewed. No pertinent past medical history.  Patient Active Problem List   Diagnosis Date Noted   Healthcare maintenance 04/16/2019   Abnormal facial hair 04/16/2019   Hyperpigmentation of skin 04/16/2019    Past Surgical History:  Procedure Laterality Date   EYE SURGERY      OB History   No obstetric history on file.      Home Medications    Prior to Admission medications   Medication Sig Start Date End Date Taking? Authorizing Provider  albuterol (VENTOLIN HFA) 108 (90 Base) MCG/ACT inhaler Inhale 1-2 puffs into the lungs every 6 (six) hours as needed for wheezing or shortness of breath. 12/29/21  Yes Stace Peace K, PA-C  predniSONE (STERAPRED UNI-PAK 21 TAB) 10 MG (21) TBPK tablet As directed 12/29/21  Yes Layanna Charo K, PA-C  promethazine-dextromethorphan (PROMETHAZINE-DM) 6.25-15 MG/5ML syrup Take 5 mLs by mouth 2 (two) times daily as needed for cough. 12/29/21  Yes Nguyen Todorov, Noberto Retort, PA-C    Family History Family History  Problem Relation Age of Onset   Healthy Mother    Healthy Father     Social History Social  History   Tobacco Use   Smoking status: Never   Smokeless tobacco: Never  Vaping Use   Vaping Use: Never used  Substance Use Topics   Alcohol use: No   Drug use: Never     Allergies   Patient has no known allergies.   Review of Systems Review of Systems  Constitutional:  Positive for activity change, fatigue and fever. Negative for appetite change.  HENT:  Positive for congestion and sore throat. Negative for sinus pressure and sneezing.   Respiratory:  Positive for cough. Negative for shortness of breath.   Cardiovascular:  Negative for chest pain.  Gastrointestinal:  Negative for abdominal pain, diarrhea, nausea and vomiting.  Neurological:  Negative for dizziness, light-headedness and headaches.     Physical Exam Triage Vital Signs ED Triage Vitals [12/29/21 1729]  Enc Vitals Group     BP 120/81     Pulse Rate 84     Resp 18     Temp 99.1 F (37.3 C)     Temp Source Oral     SpO2 96 %     Weight      Height      Head Circumference      Peak Flow      Pain Score 5     Pain Loc      Pain Edu?      Excl. in [place]?  No data found.  Updated Vital Signs BP 120/81 (BP Location: Right Arm)   Pulse 84   Temp 99.1 F (37.3 C) (Oral)   Resp 18   SpO2 96%   Visual Acuity Right Eye Distance:   Left Eye Distance:   Bilateral Distance:    Right Eye Near:   Left Eye Near:    Bilateral Near:     Physical Exam Vitals reviewed.  Constitutional:      General: She is awake. She is not in acute distress.    Appearance: Normal appearance. She is well-developed. She is not ill-appearing.     Comments: Very pleasant female appears stated age in no acute distress  HENT:     Head: Normocephalic and atraumatic.     Right Ear: Tympanic membrane, ear canal and external ear normal. Tympanic membrane is not erythematous or bulging.     Left Ear: Tympanic membrane, ear canal and external ear normal. Tympanic membrane is not erythematous or bulging.     Nose:     Right  Sinus: No maxillary sinus tenderness or frontal sinus tenderness.     Left Sinus: No maxillary sinus tenderness or frontal sinus tenderness.     Mouth/Throat:     Pharynx: Uvula midline. No oropharyngeal exudate or posterior oropharyngeal erythema.  Cardiovascular:     Rate and Rhythm: Normal rate and regular rhythm.     Heart sounds: Normal heart sounds, S1 normal and S2 normal. No murmur heard. Pulmonary:     Effort: Pulmonary effort is normal.     Breath sounds: Normal breath sounds. No wheezing, rhonchi or rales.     Comments: Clear to auscultation bilaterally; reactive cough with deep breathing Psychiatric:        Behavior: Behavior is cooperative.      UC Treatments / Results  Labs (all labs ordered are listed, but only abnormal results are displayed) Labs Reviewed - No data to display  EKG   Radiology No results found.  Procedures Procedures (including critical care time)  Medications Ordered in UC Medications - No data to display  Initial Impression / Assessment and Plan / UC Course  I have reviewed the triage vital signs and the nursing notes.  Pertinent labs & imaging results that were available during my care of the patient were reviewed by me and considered in my medical decision making (see chart for details).     Patient is well-appearing, afebrile, nontoxic, nontachycardic.  Viral testing was deferred as she has been symptomatic for 5 days and by the time he had results it would not change her management.  No evidence of acute infection on physical exam that would warrant initiation of antibiotics.  Suspect viral bronchitis.  Recommended she begin prednisone taper and was instructed not to take NSAIDs with this medication due to risk of GI bleeding.  Promethazine DM was prescribed for cough with instruction not to drive or drink alcohol with taking this medication.  She was given an inhaler for reactive cough/coughing fits.  Discussed that if she has any  worsening or changing symptoms she needs to be reevaluated immediately including chest pain, shortness of breath, fever not responding to medication, weakness, nausea/vomiting interfere with oral intake.  Strict return precautions given.  Work excuse note provided.   Final Clinical Impressions(s) / UC Diagnoses   Final diagnoses:  Viral bronchitis     Discharge Instructions      I suspect you have a virus.  Start prednisone taper.  Do  not take NSAIDs with this medication including aspirin, ibuprofen/Advil, naproxen/Aleve.  You can use acetaminophen/Tylenol.  Use albuterol every 4-6 hours as needed.  Use Promethazine DM for cough.  This will make you sleepy do not drive or drink alcohol with taking it.  You can use over-the-counter medication including Mucinex, Tylenol, Flonase.  Make sure you rest and drink plenty fluid.  If anything changes or worsens please return for reevaluation.     ED Prescriptions     Medication Sig Dispense Auth. Provider   predniSONE (STERAPRED UNI-PAK 21 TAB) 10 MG (21) TBPK tablet As directed 21 tablet Ghina Bittinger K, PA-C   albuterol (VENTOLIN HFA) 108 (90 Base) MCG/ACT inhaler Inhale 1-2 puffs into the lungs every 6 (six) hours as needed for wheezing or shortness of breath. 18 g Shunda Rabadi K, PA-C   promethazine-dextromethorphan (PROMETHAZINE-DM) 6.25-15 MG/5ML syrup Take 5 mLs by mouth 2 (two) times daily as needed for cough. 118 mL Janson Lamar K, PA-C      PDMP not reviewed this encounter.   Jeani Hawking, PA-C 12/29/21 1742

## 2021-12-29 NOTE — Discharge Instructions (Signed)
I suspect you have a virus.  Start prednisone taper.  Do not take NSAIDs with this medication including aspirin, ibuprofen/Advil, naproxen/Aleve.  You can use acetaminophen/Tylenol.  Use albuterol every 4-6 hours as needed.  Use Promethazine DM for cough.  This will make you sleepy do not drive or drink alcohol with taking it.  You can use over-the-counter medication including Mucinex, Tylenol, Flonase.  Make sure you rest and drink plenty fluid.  If anything changes or worsens please return for reevaluation.

## 2021-12-29 NOTE — ED Triage Notes (Signed)
Pt here for cough and congestion with body aches and some fever x 4 days

## 2022-03-29 ENCOUNTER — Ambulatory Visit (HOSPITAL_COMMUNITY)
Admission: EM | Admit: 2022-03-29 | Discharge: 2022-03-29 | Disposition: A | Discharge status: Home / Self Care | Payer: 59 | Attending: Emergency Medicine | Admitting: Emergency Medicine

## 2022-03-29 ENCOUNTER — Encounter (HOSPITAL_COMMUNITY): Payer: Self-pay | Admitting: Emergency Medicine

## 2022-03-29 DIAGNOSIS — N898 Other specified noninflammatory disorders of vagina: Secondary | ICD-10-CM

## 2022-03-29 DIAGNOSIS — N9089 Other specified noninflammatory disorders of vulva and perineum: Secondary | ICD-10-CM | POA: Diagnosis present

## 2022-03-29 LAB — POCT URINALYSIS DIPSTICK, ED / UC
Bilirubin Urine: NEGATIVE
Glucose, UA: NEGATIVE mg/dL
Ketones, ur: NEGATIVE mg/dL
Nitrite: NEGATIVE
Protein, ur: NEGATIVE mg/dL
Specific Gravity, Urine: 1.01 (ref 1.005–1.030)
Urobilinogen, UA: 0.2 mg/dL (ref 0.0–1.0)
pH: 7 (ref 5.0–8.0)

## 2022-03-29 LAB — POC URINE PREG, ED: Preg Test, Ur: NEGATIVE

## 2022-03-29 MED ORDER — ACYCLOVIR 400 MG PO TABS: 400.0000 mg | ORAL_TABLET | Freq: Three times a day (TID) | ORAL | 0 refills | Status: DC

## 2022-03-29 NOTE — Discharge Instructions (Signed)
On your physical exam you did have some vaginal lesions, we have swabbed you for herpes simplex and will call if this is positive.  I would like you to go ahead and start her on acyclovir 3 times daily for the next 7 days.  This should help with your pain and discomfort and symptom duration.  You can take Tylenol or ibuprofen as needed for pain.  We have swabbed you for sexually transmitted infections, we will call if any of these are positive and initiate treatment.  Please follow-up with your primary care as needed.

## 2022-03-29 NOTE — ED Triage Notes (Signed)
Pt reports either Sunday or Monday having vaginal pain. Reports noticed sores/bumps.

## 2022-03-29 NOTE — ED Provider Notes (Signed)
[facility]    CSN: [id number] Arrival date & time: 03/29/22  1015      History   Chief Complaint Chief Complaint  Patient presents with   Vaginal Pain    HPI Anna Cohen is a 27 y.o. female.   Patient reports painful sores to her labia over the past few days.  She did shave last night and attempt to better visualize the area.  Reports no new partners, her husband has been away for about a year.  Denies any history of HSV, does have a history of ingrown hairs and sores, but no confirmed HSV.  Also reports a change to her discharge.  Denies odor, dysuria, or recent intercourse.  The history is provided by the patient.  Vaginal Pain Pertinent negatives include no chest pain, no abdominal pain and no shortness of breath.    History reviewed. No pertinent past medical history.  Patient Active Problem List   Diagnosis Date Noted   Healthcare maintenance 04/16/2019   Abnormal facial hair 04/16/2019   Hyperpigmentation of skin 04/16/2019    Past Surgical History:  Procedure Laterality Date   EYE SURGERY      OB History   No obstetric history on file.      Home Medications    Prior to Admission medications   Medication Sig Start Date End Date Taking? Authorizing Provider  acyclovir (ZOVIRAX) 400 MG tablet Take 1 tablet (400 mg total) by mouth 3 (three) times daily for 7 days. 400 mg orally 3 times per day for 7 to 10 days 03/29/22 04/05/22 Yes Louretta Shorten, [place] N, FNP  albuterol (VENTOLIN HFA) 108 (90 Base) MCG/ACT inhaler Inhale 1-2 puffs into the lungs every 6 (six) hours as needed for wheezing or shortness of breath. 12/29/21   Raspet, Derry Skill, PA-C    Family History Family History  Problem Relation Age of Onset   Healthy Mother    Healthy Father     Social History Social History   Tobacco Use   Smoking status: Never   Smokeless tobacco: Never  Vaping Use   Vaping Use: Never used  Substance Use Topics   Alcohol use: No   Drug use: Never      Allergies   Patient has no known allergies.   Review of Systems Review of Systems  Constitutional:  Negative for chills and fever.  HENT:  Negative for ear pain and sore throat.   Eyes:  Negative for pain and visual disturbance.  Respiratory:  Negative for cough and shortness of breath.   Cardiovascular:  Negative for chest pain and palpitations.  Gastrointestinal:  Negative for abdominal pain and vomiting.  Genitourinary:  Positive for genital sores and vaginal discharge. Negative for dysuria, hematuria and vaginal pain.  Musculoskeletal:  Negative for arthralgias and back pain.  Skin:  Negative for color change and rash.  Neurological:  Negative for seizures and syncope.  All other systems reviewed and are negative.    Physical Exam Triage Vital Signs ED Triage Vitals  Enc Vitals Group     BP 03/29/22 1157 134/65     Pulse Rate 03/29/22 1157 (!) 50     Resp 03/29/22 1157 15     Temp 03/29/22 1157 97.9 F (36.6 C)     Temp Source 03/29/22 1157 Oral     SpO2 03/29/22 1157 98 %     Weight --      Height --      Head Circumference --  Peak Flow --      Pain Score 03/29/22 1156 7     Pain Loc --      Pain Edu? --      Excl. in [place]? --    No data found.  Updated Vital Signs BP 134/65 (BP Location: Left Arm)   Pulse (!) 50   Temp 97.9 F (36.6 C) (Oral)   Resp 15   LMP 03/08/2022   SpO2 98%   Visual Acuity Right Eye Distance:   Left Eye Distance:   Bilateral Distance:    Right Eye Near:   Left Eye Near:    Bilateral Near:     Physical Exam Vitals and nursing note reviewed. Exam conducted with a chaperone present.  Constitutional:      General: She is not in acute distress.    Appearance: Normal appearance. She is well-developed.  HENT:     Head: Normocephalic and atraumatic.     Right Ear: External ear normal.     Left Ear: External ear normal.     Nose: Nose normal.     Mouth/Throat:     Mouth: Mucous membranes are moist.  Eyes:      Conjunctiva/sclera: Conjunctivae normal.  Cardiovascular:     Rate and Rhythm: Normal rate and regular rhythm.     Heart sounds: Normal heart sounds, S1 normal and S2 normal. No murmur heard. Pulmonary:     Effort: Pulmonary effort is normal. No respiratory distress.     Breath sounds: Normal breath sounds.     Comments: Lungs vesicular posteriorly. Abdominal:     Palpations: Abdomen is soft.     Tenderness: There is no abdominal tenderness.  Genitourinary:    Pubic Area: No rash or pubic lice.      Labia:        Right: Tenderness and lesion present. No rash or injury.        Left: No rash, tenderness, lesion or injury.      Vagina: No vaginal discharge.       Comments: Small clustered group of sores to right upper labia. Tenderness to palpation. Vagina without discharge, odor, tenderness. Musculoskeletal:        General: No swelling. Normal range of motion.     Cervical back: Normal range of motion and neck supple.  Skin:    General: Skin is warm and dry.     Capillary Refill: Capillary refill takes less than 2 seconds.  Neurological:     Mental Status: She is alert.  Psychiatric:        Mood and Affect: Mood normal.        Behavior: Behavior is cooperative.      UC Treatments / Results  Labs (all labs ordered are listed, but only abnormal results are displayed) Labs Reviewed  HSV CULTURE AND TYPING  POCT URINALYSIS DIPSTICK, ED / UC  POC URINE PREG, ED  CERVICOVAGINAL ANCILLARY ONLY    EKG   Radiology No results found.  Procedures Procedures (including critical care time)  Medications Ordered in UC Medications - No data to display  Initial Impression / Assessment and Plan / UC Course  I have reviewed the triage vital signs and the nursing notes.  Pertinent labs & imaging results that were available during my care of the patient were reviewed by me and considered in my medical decision making (see chart for details).  Vitals and triage note reviewed,  patient is hemodynamically stable.  Reports new onset of vaginal  sores to right labia, denies being sexually active with a new partner, husband has been away for over a year.  Denies history of HSV.  HSV swab obtained due to physical exam of clustered and blistered sores to labia.  Due to changes in discharge, vaginal swab obtained as well.  Will go ahead and start on acyclovir due to presentation.  Will call if other results are abnormal and start treatment as indicated.  Patient verbalized understanding of plan, no questions at this time.      Final Clinical Impressions(s) / UC Diagnoses   Final diagnoses:  Lesion of labia  Vaginal discharge     Discharge Instructions      On your physical exam you did have some vaginal lesions, we have swabbed you for herpes simplex and will call if this is positive.  I would like you to go ahead and start her on acyclovir 3 times daily for the next 7 days.  This should help with your pain and discomfort and symptom duration.  You can take Tylenol or ibuprofen as needed for pain.  We have swabbed you for sexually transmitted infections, we will call if any of these are positive and initiate treatment.  Please follow-up with your primary care as needed.     ED Prescriptions     Medication Sig Dispense Auth. Provider   acyclovir (ZOVIRAX) 400 MG tablet Take 1 tablet (400 mg total) by mouth 3 (three) times daily for 7 days. 400 mg orally 3 times per day for 7 to 10 days 21 tablet Louretta Shorten, [place], [place]      I have reviewed the PDMP during this encounter.   Merrel Crabbe, [place] N, [place] 03/29/22 1217

## 2022-03-30 LAB — URINE CULTURE: Culture: NO GROWTH

## 2022-03-31 LAB — HSV CULTURE AND TYPING

## 2022-04-01 ENCOUNTER — Telehealth (HOSPITAL_COMMUNITY): Payer: Self-pay | Admitting: Emergency Medicine

## 2022-04-01 LAB — CERVICOVAGINAL ANCILLARY ONLY
Bacterial Vaginitis (gardnerella): POSITIVE — AB
Candida Glabrata: NEGATIVE
Candida Vaginitis: NEGATIVE
Chlamydia: NEGATIVE
Comment: NEGATIVE
Comment: NEGATIVE
Comment: NEGATIVE
Comment: NEGATIVE
Comment: NEGATIVE
Comment: NORMAL
Neisseria Gonorrhea: NEGATIVE
Trichomonas: NEGATIVE

## 2022-04-01 MED ORDER — METRONIDAZOLE 500 MG PO TABS: 500.0000 mg | ORAL_TABLET | Freq: Two times a day (BID) | ORAL | 0 refills | Status: DC

## 2022-11-01 ENCOUNTER — Encounter: Payer: 59 | Admitting: Family Medicine

## 2022-11-19 ENCOUNTER — Other Ambulatory Visit: Payer: Self-pay

## 2022-11-19 ENCOUNTER — Other Ambulatory Visit (HOSPITAL_COMMUNITY)
Admission: RE | Admit: 2022-11-19 | Discharge: 2022-11-19 | Disposition: A | Discharge status: Home / Self Care | Payer: 59 | Source: Ambulatory Visit | Attending: Family Medicine | Admitting: Family Medicine

## 2022-11-19 ENCOUNTER — Encounter: Payer: Self-pay | Admitting: Family Medicine

## 2022-11-19 ENCOUNTER — Ambulatory Visit: Payer: 59 | Admitting: Family Medicine

## 2022-11-19 VITALS — BP 111/76 | HR 59 | Ht 63.0 in | Wt 122.6 lb

## 2022-11-19 DIAGNOSIS — Z23 Encounter for immunization: Secondary | ICD-10-CM | POA: Diagnosis not present

## 2022-11-19 DIAGNOSIS — Z113 Encounter for screening for infections with a predominantly sexual mode of transmission: Secondary | ICD-10-CM | POA: Diagnosis present

## 2022-11-19 DIAGNOSIS — Z0000 Encounter for general adult medical examination without abnormal findings: Secondary | ICD-10-CM

## 2022-11-19 DIAGNOSIS — A6000 Herpesviral infection of urogenital system, unspecified: Secondary | ICD-10-CM

## 2022-11-19 MED ORDER — VALACYCLOVIR HCL 500 MG PO TABS: 500.0000 mg | ORAL_TABLET | Freq: Two times a day (BID) | ORAL | 0 refills | Status: DC

## 2022-11-19 NOTE — Progress Notes (Signed)
    SUBJECTIVE:   Chief compliant/HPI: annual examination  Anna Cohen is a 27 y.o. who presents today for an annual exam.   Review of systems form notable for none.   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 Does not have any at this time. Took acyclovir for it just once a day. She did not realize you had to take it multiple times a day.  Updated history tabs and problem list .   Had been diagnosed with HSV in 05/2022. Had some herpes sores recently.  OBJECTIVE:   BP 111/76   Pulse (!) 59   Ht 5\' 3"  (1.6 m)   Wt 122 lb 9.6 oz (55.6 kg)   LMP 11/02/2022   SpO2 100%   BMI 21.72 kg/m   General: well appearing, in no acute distress CV: RRR, radial pulses equal and palpable, no BLE edema  Resp: Normal work of breathing on room air, CTAB Abd: Soft, non tender, non distended  Neuro: Alert & Oriented x 4  GU (chaperoned by cma): no active lesions on vulva, small introitus, no CMT, no abnormal discharge.    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   Annual Examination  See AVS for age appropriate recommendations.   PHQ score 0, reviewed and discussed. Blood pressure reviewed and at goal.  Asked about intimate partner violence and patient reports no concerns.  Is not on contraception as she wants to get pregnant soon.   Folate recommended as appropriate, minimum of 400 mcg per day.    Considered the following items based upon USPSTF recommendations: HIV testing: ordered Hepatitis C: ordered Hepatitis B: ordered Syphilis if at high risk: ordered GC/CT at high risk, ordered.  Lipid panel (nonfasting or fasting) discussed based upon AHA recommendations and ordered.  Consider repeat every 4-6 years.  Reviewed risk factors for latent tuberculosis and not indicated  Discussed family history, BRCA testing not indicated.  Cervical cancer screening:   NILM (2022) - visualized in [facility] Immunizations due for COVID and flu    Follow up in 1 year or sooner if indicated.    Lockie Mola, MD [facility]

## 2022-11-19 NOTE — Patient Instructions (Addendum)
It was wonderful to see you today.  Please bring ALL of your medications with you to every visit.   Today we talked about:  HSV - I have prescribed you valtrex. You can take this medication whenever you notice you have any sores flaring. You can take it for 3 days once you start noticing symptoms and they should resolve.   Start taking a prenatal vitamin or some vitamin with folic acid in it.   Thank you for choosing [facility].   Please call [phone] with any questions about today's appointment.   Lockie Mola, MD  Family Medicine

## 2022-11-20 LAB — CERVICOVAGINAL ANCILLARY ONLY
Chlamydia: NEGATIVE
Comment: NEGATIVE
Comment: NEGATIVE
Comment: NORMAL
Neisseria Gonorrhea: NEGATIVE
Trichomonas: NEGATIVE

## 2022-11-20 LAB — LIPID PANEL
Chol/HDL Ratio: 2.3 ratio (ref 0.0–4.4)
Cholesterol, Total: 115 mg/dL (ref 100–199)
HDL: 50 mg/dL (ref >39)
LDL Chol Calc (NIH): 52 mg/dL (ref 0–99)
Triglycerides: 55 mg/dL (ref 0–149)
VLDL Cholesterol Cal: 13 mg/dL (ref 5–40)

## 2022-11-20 LAB — HCV AB W REFLEX TO QUANT PCR: HCV Ab: NONREACTIVE

## 2022-11-20 LAB — HEPATITIS B SURFACE ANTIGEN: Hepatitis B Surface Ag: NEGATIVE

## 2022-11-20 LAB — HIV ANTIBODY (ROUTINE TESTING W REFLEX): HIV Screen 4th Generation wRfx: NONREACTIVE

## 2022-11-20 LAB — HCV INTERPRETATION

## 2022-11-20 LAB — RPR: RPR Ser Ql: NONREACTIVE

## 2023-01-10 ENCOUNTER — Ambulatory Visit (INDEPENDENT_AMBULATORY_CARE_PROVIDER_SITE_OTHER): Payer: 59 | Admitting: Family Medicine

## 2023-01-10 ENCOUNTER — Ambulatory Visit (HOSPITAL_COMMUNITY)
Admission: RE | Admit: 2023-01-10 | Discharge: 2023-01-10 | Disposition: A | Discharge status: Home / Self Care | Payer: 59 | Source: Ambulatory Visit | Attending: Family Medicine | Admitting: Family Medicine

## 2023-01-10 VITALS — BP 111/65 | HR 65 | Ht 63.0 in | Wt 129.6 lb

## 2023-01-10 DIAGNOSIS — R9431 Abnormal electrocardiogram [ECG] [EKG]: Secondary | ICD-10-CM | POA: Insufficient documentation

## 2023-01-10 NOTE — Patient Instructions (Signed)
It was wonderful to see you today.  Please bring ALL of your medications with you to every visit.   Today we talked about:  Abnormal EKG - Given your abnormal fluttering sensation and the abnormal EKG, I am going to refer you to cardiology. I would want them to see you before signing off given your occupation.   Thank you for choosing [facility].   Please call [phone] with any questions about today's appointment.  Lockie Mola, MD  Family Medicine

## 2023-01-11 LAB — BASIC METABOLIC PANEL
BUN/Creatinine Ratio: 7 — ABNORMAL LOW (ref 9–23)
BUN: 7 mg/dL (ref 6–20)
CO2: 21 mmol/L (ref 20–29)
Calcium: 9.5 mg/dL (ref 8.7–10.2)
Chloride: 107 mmol/L — ABNORMAL HIGH (ref 96–106)
Creatinine, Ser: 1.03 mg/dL — ABNORMAL HIGH (ref 0.57–1.00)
Glucose: 82 mg/dL (ref 70–99)
Potassium: 4.3 mmol/L (ref 3.5–5.2)
Sodium: 143 mmol/L (ref 134–144)
eGFR: 76 mL/min/{1.73_m2} (ref >59)

## 2023-01-12 DIAGNOSIS — R9431 Abnormal electrocardiogram [ECG] [EKG]: Secondary | ICD-10-CM | POA: Insufficient documentation

## 2023-01-12 NOTE — Assessment & Plan Note (Signed)
Given patient's symptoms and nature of her future employment will need further evaluation. Hemodynamically stable. Not likely pericarditis as patient is not febrile, no chest pain. Could have an inherent arrhythmia such as LGL.  - Refer to cardiology   - Given ED precautions for shortness of breath, chest pain, light headedness  - BMP

## 2023-01-12 NOTE — Progress Notes (Signed)
    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 She had taken the tests three days ago. She had just started to recover from gastroenteritis after having diarrhea. Additionally patient had started her menstrual cycle. She has never had palpitations, difficulty with exercise or heart problems in her past or her family history. However for the last two days patient has noticed an odd fluttering sensation in the middle of her chest and upper stomach. No vomiting. No syncope or light headedness. The fluttering lasts a few seconds and will come on intermittently. No chest pain, shortness of breath, back pain.   PERTINENT  PMH / PSH: None  OBJECTIVE:   BP 111/65   Pulse 65   Ht 5\' 3"  (1.6 m)   Wt 129 lb 9.6 oz (58.8 kg)   SpO2 100%   BMI 22.96 kg/m   General: well appearing, in no acute distress CV: RRR, radial pulses equal and palpable, no BLE edema, cap refill < 2 seconds Resp: Normal work of breathing on room air, CTAB Abd: Soft, non tender, non distended  Neuro: Alert & Oriented x 4    EKG: shortened pr interval, inverted T waves diffusely, normal rate   ASSESSMENT/PLAN:   Assessment & Plan Abnormal EKG Given patient's symptoms and nature of her future employment will need further evaluation. Hemodynamically stable. Not likely pericarditis as patient is not febrile, no chest pain. Could have an inherent arrhythmia such as LGL.  - Refer to cardiology   - Given ED precautions for shortness of breath, chest pain, light headedness  - BMP       Lockie Mola, MD [facility]

## 2023-01-13 ENCOUNTER — Encounter: Payer: Self-pay | Admitting: Family Medicine

## 2023-01-17 ENCOUNTER — Encounter: Payer: Self-pay | Admitting: Family Medicine

## 2023-01-21 ENCOUNTER — Encounter (HOSPITAL_COMMUNITY): Payer: Self-pay | Admitting: Emergency Medicine

## 2023-01-21 ENCOUNTER — Ambulatory Visit (HOSPITAL_COMMUNITY)
Admission: EM | Admit: 2023-01-21 | Discharge: 2023-01-21 | Disposition: A | Discharge status: Home / Self Care | Payer: 59 | Attending: Physician Assistant | Admitting: Physician Assistant

## 2023-01-21 DIAGNOSIS — J9801 Acute bronchospasm: Secondary | ICD-10-CM

## 2023-01-21 DIAGNOSIS — J208 Acute bronchitis due to other specified organisms: Secondary | ICD-10-CM

## 2023-01-21 DIAGNOSIS — R051 Acute cough: Secondary | ICD-10-CM | POA: Diagnosis not present

## 2023-01-21 LAB — POC COVID19/FLU A&B COMBO
Covid Antigen, POC: NEGATIVE
Influenza A Antigen, POC: NEGATIVE
Influenza B Antigen, POC: NEGATIVE

## 2023-01-21 MED ORDER — IPRATROPIUM-ALBUTEROL 0.5-2.5 (3) MG/3ML IN SOLN
RESPIRATORY_TRACT | Status: AC
  Filled 2023-01-21: qty 3

## 2023-01-21 MED ORDER — ALBUTEROL SULFATE HFA 108 (90 BASE) MCG/ACT IN AERS: 1.0000 | INHALATION_SPRAY | Freq: Four times a day (QID) | RESPIRATORY_TRACT | 0 refills | Status: DC | PRN

## 2023-01-21 MED ORDER — METHYLPREDNISOLONE SODIUM SUCC 125 MG IJ SOLR
60.0000 mg | Freq: Once | INTRAMUSCULAR | Status: AC
  Administered 2023-01-21: 60 mg via INTRAMUSCULAR

## 2023-01-21 MED ORDER — PROMETHAZINE-DM 6.25-15 MG/5ML PO SYRP: 5.0000 mL | ORAL_SOLUTION | Freq: Two times a day (BID) | ORAL | 0 refills | Status: DC | PRN

## 2023-01-21 MED ORDER — IPRATROPIUM-ALBUTEROL 0.5-2.5 (3) MG/3ML IN SOLN
3.0000 mL | Freq: Once | RESPIRATORY_TRACT | Status: AC
  Administered 2023-01-21: 3 mL via RESPIRATORY_TRACT

## 2023-01-21 MED ORDER — PREDNISONE 20 MG PO TABS: 40.0000 mg | ORAL_TABLET | Freq: Every day | ORAL | 0 refills | Status: AC

## 2023-01-21 MED ORDER — METHYLPREDNISOLONE SODIUM SUCC 125 MG IJ SOLR
INTRAMUSCULAR | Status: AC
  Filled 2023-01-21: qty 2

## 2023-01-21 NOTE — ED Provider Notes (Signed)
[facility]    CSN: [id number] Arrival date & time: 01/21/23  1027      History   Chief Complaint Chief Complaint  Patient presents with   Sore Throat   Cough   Generalized Body Aches    HPI Anna Cohen is a 27 y.o. female.   Patient presents today with a 3 to 4-day history of URI symptoms.  She reports subjective fever, cough, sore throat, body aches, chills.  Denies any chest pain, shortness of breath, nausea, vomiting, diarrhea.  She has tried Mucinex, cough drops, other over-the-counter cold and flu medication without improvement of symptoms.  She denies any history of asthma, allergies, COPD, smoking.  She has been prescribed an albuterol inhaler for bronchitis in the past but has not been using this recently.  She denies any recent antibiotics or steroids.  She has no concern for pregnancy.  Denies any known sick contacts.  She has had COVID in 2021.  She has had initial COVID-19 vaccines but has not had most recent booster.     History reviewed. No pertinent past medical history.  Patient Active Problem List   Diagnosis Date Noted   Abnormal EKG 01/12/2023   Abnormal facial hair 04/16/2019   Hyperpigmentation of skin 04/16/2019    Past Surgical History:  Procedure Laterality Date   EYE SURGERY      OB History   No obstetric history on file.      Home Medications    Prior to Admission medications   Medication Sig Start Date End Date Taking? Authorizing Provider  predniSONE (DELTASONE) 20 MG tablet Take 2 tablets (40 mg total) by mouth daily for 4 days. 01/21/23 01/25/23 Yes Hosea Hanawalt K, PA-C  promethazine-dextromethorphan (PROMETHAZINE-DM) 6.25-15 MG/5ML syrup Take 5 mLs by mouth 2 (two) times daily as needed for cough. 01/21/23  Yes Tanicka Bisaillon K, PA-C  albuterol (VENTOLIN HFA) 108 (90 Base) MCG/ACT inhaler Inhale 1-2 puffs into the lungs every 6 (six) hours as needed for wheezing or shortness of breath. 01/21/23   Georgios Kina, Noberto Retort, PA-C   valACYclovir (VALTREX) 500 MG tablet Take 1 tablet (500 mg total) by mouth 2 (two) times daily. Take for 3 days within 24 hours of symptom onset. 11/19/22   Lockie Mola, MD    Family History Family History  Problem Relation Age of Onset   Healthy Mother    Healthy Father     Social History Social History   Tobacco Use   Smoking status: Never   Smokeless tobacco: Never  Vaping Use   Vaping status: Never Used  Substance Use Topics   Alcohol use: No   Drug use: Never     Allergies   Patient has no known allergies.   Review of Systems Review of Systems  Constitutional:  Positive for activity change, fatigue and fever. Negative for appetite change.  HENT:  Positive for congestion and sore throat. Negative for sinus pressure and sneezing.   Respiratory:  Positive for cough. Negative for shortness of breath.   Cardiovascular:  Negative for chest pain.  Gastrointestinal:  Negative for abdominal pain, diarrhea, nausea and vomiting.  Musculoskeletal:  Positive for arthralgias and myalgias.  Neurological:  Positive for headaches. Negative for dizziness and light-headedness.     Physical Exam Triage Vital Signs ED Triage Vitals  Encounter Vitals Group     BP 01/21/23 1038 108/69     Systolic BP Percentile --      Diastolic BP Percentile --  Pulse Rate 01/21/23 1038 81     Resp 01/21/23 1038 17     Temp 01/21/23 1038 98.1 F (36.7 C)     Temp Source 01/21/23 1038 Oral     SpO2 01/21/23 1038 97 %     Weight --      Height --      Head Circumference --      Peak Flow --      Pain Score 01/21/23 1037 5     Pain Loc --      Pain Education --      Exclude from Growth Chart --    No data found.  Updated Vital Signs BP 108/69 (BP Location: Right Arm)   Pulse 81   Temp 98.1 F (36.7 C) (Oral)   Resp 17   LMP 01/07/2023 (Approximate)   SpO2 97%   Visual Acuity Right Eye Distance:   Left Eye Distance:   Bilateral Distance:    Right Eye Near:   Left Eye  Near:    Bilateral Near:     Physical Exam Vitals reviewed.  Constitutional:      General: She is awake. She is not in acute distress.    Appearance: Normal appearance. She is well-developed. She is not ill-appearing.     Comments: Very pleasant female appears stated age in no acute distress sitting comfortably in exam room  HENT:     Head: Normocephalic and atraumatic.     Right Ear: Tympanic membrane, ear canal and external ear normal. Tympanic membrane is not erythematous or bulging.     Left Ear: Tympanic membrane, ear canal and external ear normal. Tympanic membrane is not erythematous or bulging.     Nose:     Right Sinus: Maxillary sinus tenderness present. No frontal sinus tenderness.     Left Sinus: Maxillary sinus tenderness present. No frontal sinus tenderness.     Mouth/Throat:     Pharynx: Uvula midline. Postnasal drip present. No oropharyngeal exudate or posterior oropharyngeal erythema.  Cardiovascular:     Rate and Rhythm: Normal rate and regular rhythm.     Heart sounds: Normal heart sounds, S1 normal and S2 normal. No murmur heard. Pulmonary:     Effort: Pulmonary effort is normal.     Breath sounds: Wheezing present. No rhonchi or rales.     Comments: Scattered wheezing throughout lung fields Psychiatric:        Behavior: Behavior is cooperative.      UC Treatments / Results  Labs (all labs ordered are listed, but only abnormal results are displayed) Labs Reviewed  POC COVID19/FLU A&B COMBO    EKG   Radiology No results found.  Procedures Procedures (including critical care time)  Medications Ordered in UC Medications  ipratropium-albuterol (DUONEB) 0.5-2.5 (3) MG/3ML nebulizer solution 3 mL (3 mLs Nebulization Given 01/21/23 1112)  methylPREDNISolone sodium succinate (SOLU-MEDROL) 125 mg/2 mL injection 60 mg (60 mg Intramuscular Given 01/21/23 1112)    Initial Impression / Assessment and Plan / UC Course  I have reviewed the triage vital signs  and the nursing notes.  Pertinent labs & imaging results that were available during my care of the patient were reviewed by me and considered in my medical decision making (see chart for details).     Patient is well-appearing, afebrile, nontoxic, nontachycardic.  No evidence of acute infection on physical exam that would warrant initiation of antibiotics.  Suspect viral etiology.  COVID and flu testing were negative in clinic today.  Chest x-ray was deferred as wheezing/rhonchi resolved with DuoNeb in clinic and her oxygen saturation was 97%.  She was given DuoNeb as well as Solu-Medrol with improvement of symptoms.  Will start prednisone burst outpatient beginning tomorrow (01/22/2023) and she was instructed not to take NSAIDs with this medication.  She was given albuterol to be used every 4-6 hours as needed as well as Promethazine DM for cough.  We discussed that Promethazine DM can be sedating and she is not to drive or drink alcohol taking it.  She is to rest and drink plenty of fluid.  If her symptoms are not improving within a week she is to return for reevaluation.  If she has any worsening symptoms she needs to be seen immediately.  Strict return precautions given.  Work excuse note provided.  Final Clinical Impressions(s) / UC Diagnoses   Final diagnoses:  Viral bronchitis  Bronchospasm  Acute cough     Discharge Instructions      You were negative for flu and COVID.  I believe you have a different virus that is causing inflammation in your airway leading to bronchitis.  Use albuterol every 4-6 hours as needed.  Start prednisone tomorrow (01/22/2023).  Do not take NSAIDs with this medication including aspirin, ibuprofen/Advil, naproxen/Aleve.  Use Promethazine DM for cough.  This will make you sleepy so do not drive or drink alcohol while taking it.  Make sure that you rest and drink plenty of fluid.  If your symptoms are not improving within a week please return for reevaluation.  If  anything worsens you have worsening cough, shortness of breath, chest pain, nausea, vomiting you need to be seen immediately.     ED Prescriptions     Medication Sig Dispense Auth. Provider   albuterol (VENTOLIN HFA) 108 (90 Base) MCG/ACT inhaler Inhale 1-2 puffs into the lungs every 6 (six) hours as needed for wheezing or shortness of breath. 18 g Junia Nygren K, PA-C   predniSONE (DELTASONE) 20 MG tablet Take 2 tablets (40 mg total) by mouth daily for 4 days. 8 tablet Grenda Lora K, PA-C   promethazine-dextromethorphan (PROMETHAZINE-DM) 6.25-15 MG/5ML syrup Take 5 mLs by mouth 2 (two) times daily as needed for cough. 118 mL Mena Lienau K, PA-C      PDMP not reviewed this encounter.   Jeani Hawking, PA-C 01/21/23 1200

## 2023-01-21 NOTE — ED Triage Notes (Signed)
Pt c/o cough, sore throat, body aches, chills since Saturday.

## 2023-01-21 NOTE — Discharge Instructions (Signed)
You were negative for flu and COVID.  I believe you have a different virus that is causing inflammation in your airway leading to bronchitis.  Use albuterol every 4-6 hours as needed.  Start prednisone tomorrow (01/22/2023).  Do not take NSAIDs with this medication including aspirin, ibuprofen/Advil, naproxen/Aleve.  Use Promethazine DM for cough.  This will make you sleepy so do not drive or drink alcohol while taking it.  Make sure that you rest and drink plenty of fluid.  If your symptoms are not improving within a week please return for reevaluation.  If anything worsens you have worsening cough, shortness of breath, chest pain, nausea, vomiting you need to be seen immediately.

## 2023-03-19 ENCOUNTER — Encounter: Payer: Self-pay | Admitting: Cardiology

## 2023-03-19 ENCOUNTER — Ambulatory Visit: Payer: 59 | Attending: Cardiology | Admitting: Cardiology

## 2023-03-19 ENCOUNTER — Ambulatory Visit (HOSPITAL_BASED_OUTPATIENT_CLINIC_OR_DEPARTMENT_OTHER): Payer: 59

## 2023-03-19 ENCOUNTER — Ambulatory Visit (INDEPENDENT_AMBULATORY_CARE_PROVIDER_SITE_OTHER): Payer: 59

## 2023-03-19 VITALS — BP 118/66 | HR 66 | Resp 18 | Ht 63.0 in | Wt 124.4 lb

## 2023-03-19 DIAGNOSIS — R9431 Abnormal electrocardiogram [ECG] [EKG]: Secondary | ICD-10-CM | POA: Insufficient documentation

## 2023-03-19 LAB — EXERCISE TOLERANCE TEST
Angina Index: 0
Duke Treadmill Score: -3
Estimated workload: 13.4
Exercise duration (min): 12 min
Exercise duration (sec): 0 s
MPHR: 193 {beats}/min
Peak HR: 166 {beats}/min
Percent HR: 86 %
RPE: 17
Rest HR: 53 {beats}/min
ST Depression (mm): 3 mm

## 2023-03-19 LAB — ECHOCARDIOGRAM COMPLETE
Area-P 1/2: 2.87 cm2
Height: 63 in
S' Lateral: 2.6 cm
Weight: 1990.4 [oz_av]

## 2023-03-19 NOTE — Addendum Note (Signed)
Addended by: Delrae Rend on: 03/19/2023 06:48 PM   Modules accepted: Orders

## 2023-03-19 NOTE — Progress Notes (Signed)
Please send a referral to Dr. Berton Mount for abnormal EKG. He is aware of the patient just to close the loop. He will see her quickly.  Will order Cardiac MRI, Dr. Jacques Navy to read. Indication abnormal EKG.

## 2023-03-19 NOTE — Addendum Note (Signed)
Addended by: Delrae Rend on: 03/19/2023 06:45 PM   Modules accepted: Orders

## 2023-03-19 NOTE — Progress Notes (Signed)
Stress EKG tracings personally reviewed. Excellent exercise tolerance. Low risk. Normal BP response. No inducible arrhythmias. I have placed EP consult and MRI orders

## 2023-03-19 NOTE — Progress Notes (Signed)
Cardiology Office Note:  .   Date:  03/19/2023  ID:  Anna Cohen, DOB 06-06-1995, MRN [id number] PCP: Lockie Mola, MD  [facility] Providers Cardiologist:  Yates Decamp, MD   History of Present Illness: .   Anna Cohen is a 28 y.o. 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  She is accompanied by her mother.  There is no family history of sudden cardiac death, no family history of any cardiac conduction abnormality, no history of any birth injuries, she is a non-smoker, does not use any illicit drug, drinks alcohol very rarely.  Discussed the use of AI scribe software for clinical note transcription with the patient, who gave verbal consent to proceed.  History of Present Illness   The patient, a young, healthy, non-smoking, non-drug using, asymptomatic individual with no family history of heart disease, presents for evaluation of an abnormal EKG discovered during a medical exam for a police force application. The patient has no history of heart racing symptoms or syncope. The patient is currently a Biochemist, clinical and is physically active, regularly working out and moving around. The patient has previously been informed of an abnormal EKG, but was told it was not a concern.       Labs   Lab Results  Component Value Date   CHOL 115 11/19/2022   HDL 50 11/19/2022   LDLCALC 52 11/19/2022   TRIG 55 11/19/2022   CHOLHDL 2.3 11/19/2022   Lab Results  Component Value Date   NA 143 01/10/2023   K 4.3 01/10/2023   CO2 21 01/10/2023   GLUCOSE 82 01/10/2023   BUN 7 01/10/2023   CREATININE 1.03 (H) 01/10/2023   CALCIUM 9.5 01/10/2023   EGFR 76 01/10/2023      Latest Ref Rng & Units 01/10/2023   11:47 AM  BMP  Glucose 70 - 99 mg/dL 82   BUN 6 -  20 mg/dL 7   Creatinine 2.44 - 0.10 mg/dL 2.72   BUN/Creat Ratio 9 - 23 7   Sodium 134 - 144 mmol/L 143   Potassium 3.5 - 5.2 mmol/L 4.3   Chloride 96 - 106 mmol/L 107   CO2 20 - 29 mmol/L 21   Calcium 8.7 - 10.2 mg/dL 9.5        No data to display         No results found for: "HGBA1C"  No results found for: "TSH"   Review of Systems  Cardiovascular:  Negative for chest pain, dyspnea on exertion and leg swelling.   Physical Exam:   VS:  BP 118/66 (BP Location: Left Arm, Patient Position: Sitting, Cuff Size: Normal)   Pulse 66   Resp 18   Ht 5\' 3"  (1.6 m)   Wt 124 lb 6.4 oz (56.4 kg)   SpO2 94%   BMI 22.04 kg/m    Wt Readings from Last 3 Encounters:  03/19/23 124 lb 6.4 oz (56.4 kg)  01/10/23 129 lb 9.6 oz (58.8 kg)  11/19/22 122 lb 9.6 oz (55.6 kg)    Physical Exam Neck:     Vascular: No carotid bruit or JVD.  Cardiovascular:     Rate and Rhythm: Normal rate and regular rhythm.     Pulses: Intact distal pulses.  Heart sounds: Normal heart sounds. No murmur heard.    No gallop.     Comments: Carotid sinus massage performed, patient remained asymptomatic.  Pulmonary:     Effort: Pulmonary effort is normal.     Breath sounds: Normal breath sounds.  Abdominal:     General: Bowel sounds are normal.     Palpations: Abdomen is soft.  Musculoskeletal:     Right lower leg: No edema.     Left lower leg: No edema.    Studies Reviewed: .    None EKG:    EKG Interpretation Date/Time:  Wednesday March 19 2023 09:47:29 EST Ventricular Rate:  69 PR Interval:  80 QRS Duration:  110 QT Interval:  400 QTC Calculation: 428 R Axis:   71  Text Interpretation: EKG 03/19/2023 at 9:46 AM: Sinus rhythm with short PR interval at the rate of 62 bpm, slight upsloping delta wave most noticeable in the chest leads with secondary ST-T wave abnormality suggestive of LVH with strain versus repolarization abnormality secondary to WPW pattern on EKG.    Repeat EKG at 9:47 AM  with carotid massage: No change in PR interval, P wave morphology or QRS duration. Confirmed by Delrae Rend [phone]) on 03/19/2023 9:56:14 AM     EKG 01/10/2023: Sinus rhythm with short PR interval at rate of 54 bpm with delta waves, nonspecific inferolateral T wave inversion, consider repolarization abnormality versus LVH with strain.  Medications and allergies    No Known Allergies   Current Outpatient Medications:    albuterol (VENTOLIN HFA) 108 (90 Base) MCG/ACT inhaler, Inhale 1-2 puffs into the lungs every 6 (six) hours as needed for wheezing or shortness of breath., Disp: 18 g, Rfl: 0   valACYclovir (VALTREX) 500 MG tablet, Take 1 tablet (500 mg total) by mouth 2 (two) times daily. Take for 3 days within 24 hours of symptom onset., Disp: 36 tablet, Rfl: 0   ASSESSMENT AND PLAN: .      ICD-10-CM   1. Abnormal EKG  R94.31 EKG 12-Lead    ECHOCARDIOGRAM COMPLETE    EXERCISE TOLERANCE TEST (ETT)    Cardiac Stress Test: Informed Consent Details: Physician/Practitioner Attestation; Transcribe to consent form and obtain patient signature      Pre-excitation EKG (likely Wolff-Parkinson-White pattern) asymptomatic. Asymptomatic with no syncope, palpitations, or family history of sudden cardiac death. EKG shows a short PR interval and slight slur suggestive of delta wave, suggesting an accessory pathway likely within the bundle of His.  There was no change in QRS widening with carotid massage and no change in P wave morphology as well.  No change in PR interval as well.  Physically active, working as a Biochemist, clinical. Further evaluation is needed to rule out structural heart abnormalities and confirm diagnosis. An echocardiogram and treadmill stress test, both standard and low-risk procedures, will be conducted. Given the asymptomatic status and normal activity level, significant abnormalities are unlikely.   No follow-up is necessary unless abnormalities are detected in the  echocardiogram or treadmill test.  In the absence of any structural heart disease, patient being asymptomatic, if the above tests are normal, I do not think she is at increased risk for sudden cardiac death or arrhythmic death.  Patient's mother is present and all questions answered.  I will make final recommendation after the stress test and echocardiogram.   Signed,  Yates Decamp, MD, [facility] 03/19/2023, 9:58 AM [facility] [address] [place], [place] [address] Phone: [phone]. Fax:  [phone]

## 2023-03-19 NOTE — Patient Instructions (Signed)
 Medication Instructions:  Your physician recommends that you continue on your current medications as directed. Please refer to the Current Medication list given to you today.  *If you need a refill on your cardiac medications before your next appointment, please call your pharmacy*   Lab Work: none If you have labs (blood work) drawn today and your tests are completely normal, you will receive your results only by: MyChart Message (if you have MyChart) OR A paper copy in the mail If you have any lab test that is abnormal or we need to change your treatment, we will call you to review the results.   Testing/Procedures: Your physician has requested that you have an exercise tolerance test. For further information please visit [email or web address] Please also follow instruction sheet, as given.  Your physician has requested that you have an echocardiogram. Echocardiography is a painless test that uses sound waves to create images of your heart. It provides your doctor with information about the size and shape of your heart and how well your heart's chambers and valves are working. This procedure takes approximately one hour. There are no restrictions for this procedure. Please do NOT wear cologne, perfume, aftershave, or lotions (deodorant is allowed). Please arrive 15 minutes prior to your appointment time.  Please note: We ask at that you not bring children with you during ultrasound (echo/ vascular) testing. Due to room size and safety concerns, children are not allowed in the ultrasound rooms during exams. Our front office staff cannot provide observation of children in our lobby area while testing is being conducted. An adult accompanying a patient to their appointment will only be allowed in the ultrasound room at the discretion of the ultrasound technician under special circumstances. We apologize for any inconvenience.    Follow-Up: At [facility], you and your health needs  are our priority.  As part of our continuing mission to provide you with exceptional heart care, we have created designated Provider Care Teams.  These Care Teams include your primary Cardiologist (physician) and Advanced Practice Providers (APPs -  Physician Assistants and Nurse Practitioners) who all work together to provide you with the care you need, when you need it.  We recommend signing up for the patient portal called "MyChart".  Sign up information is provided on this After Visit Summary.  MyChart is used to connect with patients for Virtual Visits (Telemedicine).  Patients are able to view lab/test results, encounter notes, upcoming appointments, etc.  Non-urgent messages can be sent to your provider as well.   To learn more about what you can do with MyChart, go to [email or web address].    Your next appointment:   As needed  Provider:   Yates Decamp, MD     Other Instructions

## 2023-03-21 ENCOUNTER — Encounter: Payer: Self-pay | Admitting: Internal Medicine

## 2023-03-21 ENCOUNTER — Ambulatory Visit: Payer: 59 | Attending: Internal Medicine | Admitting: Internal Medicine

## 2023-03-21 VITALS — BP 98/70 | HR 58 | Ht 63.0 in | Wt 125.0 lb

## 2023-03-21 DIAGNOSIS — R9431 Abnormal electrocardiogram [ECG] [EKG]: Secondary | ICD-10-CM

## 2023-03-21 NOTE — Progress Notes (Signed)
ELECTROPHYSIOLOGY CONSULT NOTE  Patient ID: Anna Cohen, MRN: [id number], DOB/AGE: 1995/07/19 28 y.o. Admit date: (Not on file) Date of Consult: 03/21/2023  Primary Physician: Lockie Mola, MD Primary Cardiologist: Anna Cohen is a 28 y.o. female who is being seen today for the evaluation of abnormal ECG  at the request of Anna Cohen.    HPI Anna Cohen is a 28 y.o. female referred because of an abnormal electrocardiogram with a very short PR interval and possible preexcitation  The patient was born extremely prematurely, saying 5 months early but she was 1-1/2 pounds so it may well have been 24-26 weeks.  She has no cardiovascular issues of which she is aware.  No palpitations.  No lightheadedness.  No syncope.  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  She had been told this in the past.  DATE TEST EF   2/25 Echo  60- 65 % Prominent trabeculations         GXT to heart rate of 166 was associate with no loss of preexcitation     History reviewed. No pertinent past medical history.    Surgical History:  Past Surgical History:  Procedure Laterality Date   EYE SURGERY       Home Meds: Current Meds  Medication Sig   albuterol (VENTOLIN HFA) 108 (90 Base) MCG/ACT inhaler Inhale 1-2 puffs into the lungs every 6 (six) hours as needed for wheezing or shortness of breath.   valACYclovir (VALTREX) 500 MG tablet Take 1 tablet (500 mg total) by mouth 2 (two) times daily. Take for 3 days within 24 hours of symptom onset.    Allergies: No Known Allergies  Social History   Socioeconomic History   Marital status: Married    Spouse name: Not on file   Number of children: Not on file   Years of education: Not on file   Highest education level: Master's degree (e.g., MA, MS, MEng, MEd, MSW, MBA)   Occupational History   Not on file  Tobacco Use   Smoking status: Never   Smokeless tobacco: Never  Vaping Use   Vaping status: Never Used  Substance and Sexual Activity   Alcohol use: No   Drug use: Never   Sexual activity: Yes    Comment: "Plan B"  Other Topics Concern   Not on file  Social History Narrative   Not on file   Social Drivers of Health   Financial Resource Strain: Low Risk  (11/15/2022)   Overall Financial Resource Strain (CARDIA)    Difficulty of Paying Living Expenses: Not hard at all  Food Insecurity: No Food Insecurity (11/15/2022)   Hunger Vital Sign    Worried About Running Out of Food in the Last Year: Never true    Ran Out of Food in the Last Year: Never true  Transportation Needs: No Transportation Needs (11/15/2022)   PRAPARE - Administrator, Civil Service (Medical): No    Lack of Transportation (Non-Medical): No  Physical Activity: Sufficiently Active (11/15/2022)   Exercise Vital Sign    Days of Exercise per Week: 3 days    Minutes of Exercise per Session: 60 min  Stress: No Stress Concern Present (11/15/2022)   [name] of Occupational Health - Occupational Stress Questionnaire  Feeling of Stress : Not at all  Social Connections: Moderately Integrated (11/15/2022)   Social Connection and Isolation Panel [NHANES]    Frequency of Communication with Friends and Family: More than three times a week    Frequency of Social Gatherings with Friends and Family: Once a week    Attends Religious Services: More than 4 times per year    Active Member of [employer] or Organizations: No    Attends Engineer, structural: Not on file    Marital Status: Married  Intimate Partner Violence: Unknown (05/04/2021)   Received from [employer] [facility], [facility]   HITS    Physically Hurt: Not on file    Insult or Talk Down To: Not on file    Threaten Physical Harm: Not on file    Scream or Curse: Not on file     Family History  Problem  Relation Age of Onset   Healthy Mother    Healthy Father      ROS:  Please see the history of present illness.     All other systems reviewed and negative.    Physical Exam: Blood pressure 98/70, pulse (!) 58, height 5\' 3"  (1.6 m), weight 125 lb (56.7 kg), SpO2 98%. General: Well developed, well nourished female in no acute distress. Head: Normocephalic, atraumatic, sclera non-icteric, no xanthomas, nares are without discharge. EENT: normal  Lymph Nodes:  none Neck: Negative for carotid bruits. JVD not elevated. Back:without scoliosis kyphosis Lungs: Clear bilaterally to auscultation without wheezes, rales, or rhonchi. Breathing is unlabored. Heart: RRR with S1 S2. No murmur . No rubs, or gallops appreciated. Abdomen: Soft, non-tender, non-distended with normoactive bowel sounds. No hepatomegaly. No rebound/guarding. No obvious abdominal masses. Msk:  Strength and tone appear normal for age. Extremities: No clubbing or cyanosis. No edema.  Distal pedal pulses are 2+ and equal bilaterally. Skin: Warm and Dry Neuro: Alert and oriented X 3. CN III-XII intact Grossly normal sensory and motor function . Psych:  Responds to questions appropriately with a normal affect.        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  Typically preexcitation is associated with fusion of waveforms over the AV node and the accessory pathway resulting in a total time of the PR/QRSd in the range of 200 to 250 ms at a minimum.  I am impressed at the very narrowness of this PQRS duration interval making me wonder what is the nature of the bypass tract. Will  reach out to colleagues, because in the absence of symptoms but in the high risk profession EP testing for clarification of the mechanism and ablation if they could be done at very low risk, perhaps with cryoablation might perchance reduce her risk of sudden cardiac death associated with preexcitation syndromes even in the absence of symptoms.  Repolarization abnormalities might be related to the preexcitation; in the context of her "prominent trabeculations "on echocardiogram HCM should also be excluded given the voltage; and in this regard there are preexcitation HCM phenocopy syndromes.  I cannot find any information as to whether very low birthweight infants, probably extremely low birthweight infants are associated with a higher risk of noncompaction or  prominent trabeculations given the arrested in utero development.  Agree with the role of cMRI       Sherryl Manges

## 2023-03-21 NOTE — Patient Instructions (Signed)
Medication Instructions:  The current medical regimen is effective;  continue present plan and medications.  *If you need a refill on your cardiac medications before your next appointment, please call your pharmacy*   Follow-Up: At [facility], you and your health needs are our priority.  As part of our continuing mission to provide you with exceptional heart care, we have created designated Provider Care Teams.  These Care Teams include your primary Cardiologist (physician) and Advanced Practice Providers (APPs -  Physician Assistants and Nurse Practitioners) who all work together to provide you with the care you need, when you need it.  We recommend signing up for the patient portal called "MyChart".  Sign up information is provided on this After Visit Summary.  MyChart is used to connect with patients for Virtual Visits (Telemedicine).  Patients are able to view lab/test results, encounter notes, upcoming appointments, etc.  Non-urgent messages can be sent to your provider as well.   To learn more about what you can do with MyChart, go to [email or web address].    Your next appointment:   As needed  Provider:   Sherryl Manges, MD

## 2023-03-24 ENCOUNTER — Encounter (HOSPITAL_COMMUNITY): Payer: Self-pay

## 2023-03-26 ENCOUNTER — Encounter: Payer: Self-pay | Admitting: *Deleted

## 2023-03-26 ENCOUNTER — Encounter: Payer: Self-pay | Admitting: Internal Medicine

## 2023-03-26 ENCOUNTER — Ambulatory Visit: Payer: 59 | Attending: Internal Medicine | Admitting: Internal Medicine

## 2023-03-26 VITALS — BP 110/68 | HR 62 | Ht 63.0 in | Wt 126.6 lb

## 2023-03-26 DIAGNOSIS — I456 Pre-excitation syndrome: Secondary | ICD-10-CM | POA: Diagnosis not present

## 2023-03-26 DIAGNOSIS — Z01818 Encounter for other preprocedural examination: Secondary | ICD-10-CM | POA: Diagnosis not present

## 2023-03-26 NOTE — Patient Instructions (Addendum)
 Medication Instructions:  Your physician recommends that you continue on your current medications as directed. Please refer to the Current Medication list given to you today.  April 1   *If you need a refill on your cardiac medications before your next appointment, please call your pharmacy*   Lab Work: Your physician recommends that you return for lab work just prior to ablation.   If you have labs (blood work) drawn today and your tests are completely normal, you will receive your results only by: MyChart Message (if you have MyChart) OR A paper copy in the mail If you have any lab test that is abnormal or we need to change your treatment, we will call you to review the results.   Testing/Procedures: Your physician has recommended that you have an ablation. Catheter ablation is a medical procedure used to treat some cardiac arrhythmias (irregular heartbeats). During catheter ablation, a long, thin, flexible tube is put into a blood vessel in your groin (upper thigh), or neck. This tube is called an ablation catheter. It is then guided to your heart through the blood vessel. Radio frequency waves destroy small areas of heart tissue where abnormal heartbeats may cause an arrhythmia to start. Please see the instruction sheet given to you today.    Follow-Up: At [facility], you and your health needs are our priority.  As part of our continuing mission to provide you with exceptional heart care, we have created designated Provider Care Teams.  These Care Teams include your primary Cardiologist (physician) and Advanced Practice Providers (APPs -  Physician Assistants and Nurse Practitioners) who all work together to provide you with the care you need, when you need it.  We recommend signing up for the patient portal called "MyChart".  Sign up information is provided on this After Visit Summary.  MyChart is used to connect with patients for Virtual Visits (Telemedicine).  Patients are  able to view lab/test results, encounter notes, upcoming appointments, etc.  Non-urgent messages can be sent to your provider as well.   To learn more about what you can do with MyChart, go to [email or web address].    Your next appointment:   4 week(s)  Provider:   Lewayne Bunting, MD    Other Instructions Thank you for choosing [facility]!

## 2023-03-26 NOTE — Progress Notes (Signed)
 HPI Ms. Campoverde is referred by Dr. 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 She is a pleasant 28 yo woman who was born premature but has been healthy. 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 She has had an employment physical which shows WPW and she presents today to discuss treatment options. She denies palpitations. She has never had known SVT or afib. No syncope.  No Known Allergies   Current Outpatient Medications  Medication Sig Dispense Refill   valACYclovir (VALTREX) 500 MG tablet Take 1 tablet (500 mg total) by mouth 2 (two) times daily. Take for 3 days within 24 hours of symptom onset. 36 tablet 0   No current facility-administered medications for this visit.     History reviewed. No pertinent past medical history.  ROS:   All systems reviewed and negative except as noted in the HPI.   Past Surgical History:  Procedure Laterality Date   EYE SURGERY       Family History  Problem Relation Age of Onset   Healthy Mother    Healthy Father      Social History   Socioeconomic History   Marital status: Married    Spouse name: Not on file   Number of children: Not on file   Years of education: Not on file   Highest education level: Master's degree (e.g., MA, MS, MEng, MEd, MSW, MBA)  Occupational History   Not on file  Tobacco Use   Smoking status: Never   Smokeless tobacco: Never  Vaping Use   Vaping status: Never Used  Substance and Sexual Activity   Alcohol use: No   Drug use: Never   Sexual activity: Yes    Comment: "Plan B"  Other Topics Concern   Not on file  Social History Narrative   Not on file   Social Drivers of Health   Financial Resource Strain: Low Risk  (11/15/2022)   Overall Financial Resource Strain (CARDIA)    Difficulty of Paying Living Expenses: Not hard at all  Food Insecurity: No Food Insecurity (11/15/2022)   Hunger Vital Sign    Worried About  Running Out of Food in the Last Year: Never true    Ran Out of Food in the Last Year: Never true  Transportation Needs: No Transportation Needs (11/15/2022)   PRAPARE - Administrator, Civil Service (Medical): No    Lack of Transportation (Non-Medical): No  Physical Activity: Sufficiently Active (11/15/2022)   Exercise Vital Sign    Days of Exercise per Week: 3 days    Minutes of Exercise per Session: 60 min  Stress: No Stress Concern Present (11/15/2022)   [name] of Occupational Health - Occupational Stress Questionnaire    Feeling of Stress : Not at all  Social Connections: Moderately Integrated (11/15/2022)   Social Connection and Isolation Panel [NHANES]    Frequency of Communication with Friends and Family: More than three times a week    Frequency of Social Gatherings with Friends and Family: Once a week    Attends Religious Services: More than 4 times per year    Active Member of [employer] or Organizations: No    Attends Banker Meetings: Not on file    Marital Status: Married  Intimate Partner Violence: Unknown (05/04/2021)   Received from [employer] [facility], [facility]   HITS    Physically Hurt: Not on file  Insult or Talk Down To: Not on file    Threaten Physical Harm: Not on file    Scream or Curse: Not on file     BP 110/68   Pulse 62   Ht 5\' 3"  (1.6 m)   Wt 126 lb 9.6 oz (57.4 kg)   SpO2 98%   BMI 22.43 kg/m   Physical Exam:  Well appearing young woman, NAD HEENT: Unremarkable Neck:  No JVD, no thyromegally Lymphatics:  No adenopathy Back:  No CVA tenderness Lungs:  Clear with no wheezes HEART:  Regular rate rhythm, no murmurs, no rubs, no clicks Abd:  soft, positive bowel sounds, no organomegally, no rebound, no guarding Ext:  2 plus pulses, no edema, no cyanosis, no clubbing Skin:  No rashes no nodules Neuro:  CN II through XII intact, motor grossly intact  EKG - reviewed. NSR with WPW. PQRS is very short.    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 I also offered her definitive catheter ablation. Her AP may or may not be close to the AV node. If it were then ablation would not be undertaken. She is considering and will call us as to decide what to do.   Sharlot Gowda Kenzey Birkland,MD

## 2023-03-26 NOTE — H&P (View-Only) (Signed)
 HPI Anna Cohen is referred by Dr. 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 She is a pleasant 28 yo woman who was born premature but has been healthy. 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 She has had an employment physical which shows WPW and she presents today to discuss treatment options. She denies palpitations. She has never had known SVT or afib. No syncope.  No Known Allergies   Current Outpatient Medications  Medication Sig Dispense Refill   valACYclovir (VALTREX) 500 MG tablet Take 1 tablet (500 mg total) by mouth 2 (two) times daily. Take for 3 days within 24 hours of symptom onset. 36 tablet 0   No current facility-administered medications for this visit.     History reviewed. No pertinent past medical history.  ROS:   All systems reviewed and negative except as noted in the HPI.   Past Surgical History:  Procedure Laterality Date   EYE SURGERY       Family History  Problem Relation Age of Onset   Healthy Mother    Healthy Father      Social History   Socioeconomic History   Marital status: Married    Spouse name: Not on file   Number of children: Not on file   Years of education: Not on file   Highest education level: Master's degree (e.g., MA, MS, MEng, MEd, MSW, MBA)  Occupational History   Not on file  Tobacco Use   Smoking status: Never   Smokeless tobacco: Never  Vaping Use   Vaping status: Never Used  Substance and Sexual Activity   Alcohol use: No   Drug use: Never   Sexual activity: Yes    Comment: "Plan B"  Other Topics Concern   Not on file  Social History Narrative   Not on file   Social Drivers of Health   Financial Resource Strain: Low Risk  (11/15/2022)   Overall Financial Resource Strain (CARDIA)    Difficulty of Paying Living Expenses: Not hard at all  Food Insecurity: No Food Insecurity (11/15/2022)   Hunger Vital Sign    Worried About  Running Out of Food in the Last Year: Never true    Ran Out of Food in the Last Year: Never true  Transportation Needs: No Transportation Needs (11/15/2022)   PRAPARE - Administrator, Civil Service (Medical): No    Lack of Transportation (Non-Medical): No  Physical Activity: Sufficiently Active (11/15/2022)   Exercise Vital Sign    Days of Exercise per Week: 3 days    Minutes of Exercise per Session: 60 min  Stress: No Stress Concern Present (11/15/2022)   [name] of Occupational Health - Occupational Stress Questionnaire    Feeling of Stress : Not at all  Social Connections: Moderately Integrated (11/15/2022)   Social Connection and Isolation Panel [NHANES]    Frequency of Communication with Friends and Family: More than three times a week    Frequency of Social Gatherings with Friends and Family: Once a week    Attends Religious Services: More than 4 times per year    Active Member of [employer] or Organizations: No    Attends Banker Meetings: Not on file    Marital Status: Married  Intimate Partner Violence: Unknown (05/04/2021)   Received from [employer], [facility]   HITS    Physically Hurt: Not on file  Insult or Talk Down To: Not on file    Threaten Physical Harm: Not on file    Scream or Curse: Not on file     BP 110/68   Pulse 62   Ht 5\' 3"  (1.6 m)   Wt 126 lb 9.6 oz (57.4 kg)   SpO2 98%   BMI 22.43 kg/m   Physical Exam:  Well appearing young woman, NAD HEENT: Unremarkable Neck:  No JVD, no thyromegally Lymphatics:  No adenopathy Back:  No CVA tenderness Lungs:  Clear with no wheezes HEART:  Regular rate rhythm, no murmurs, no rubs, no clicks Abd:  soft, positive bowel sounds, no organomegally, no rebound, no guarding Ext:  2 plus pulses, no edema, no cyanosis, no clubbing Skin:  No rashes no nodules Neuro:  CN II through XII intact, motor grossly intact  EKG - reviewed. NSR with WPW. PQRS is very short.    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 I also offered her definitive catheter ablation. Her AP may or may not be close to the AV node. If it were then ablation would not be undertaken. She is considering and will call us as to decide what to do.   Anna Gowda Kenzey Birkland,MD

## 2023-03-28 ENCOUNTER — Encounter (HOSPITAL_COMMUNITY): Payer: Self-pay

## 2023-04-01 ENCOUNTER — Other Ambulatory Visit: Payer: Self-pay | Admitting: Cardiology

## 2023-04-01 ENCOUNTER — Encounter: Payer: Self-pay | Admitting: Cardiology

## 2023-04-01 ENCOUNTER — Ambulatory Visit (HOSPITAL_COMMUNITY)
Admission: RE | Admit: 2023-04-01 | Discharge: 2023-04-01 | Disposition: A | Discharge status: Home / Self Care | Payer: 59 | Source: Ambulatory Visit | Attending: Cardiology | Admitting: Cardiology

## 2023-04-01 DIAGNOSIS — R9431 Abnormal electrocardiogram [ECG] [EKG]: Secondary | ICD-10-CM

## 2023-04-01 MED ORDER — GADOBUTROL 1 MMOL/ML IV SOLN
9.0000 mL | Freq: Once | INTRAVENOUS | Status: AC | PRN
  Administered 2023-04-01: 9 mL via INTRAVENOUS

## 2023-04-01 NOTE — Progress Notes (Signed)
 CMR normal

## 2023-04-01 NOTE — Progress Notes (Signed)
 Normal MRI of the heart

## 2023-04-04 ENCOUNTER — Encounter (HOSPITAL_COMMUNITY): Payer: Self-pay | Admitting: Emergency Medicine

## 2023-04-08 ENCOUNTER — Encounter: Payer: Self-pay | Admitting: Internal Medicine

## 2023-04-11 ENCOUNTER — Telehealth (HOSPITAL_COMMUNITY): Payer: Self-pay

## 2023-04-11 NOTE — Telephone Encounter (Signed)
 Attempted to reach patient to discuss upcoming procedure, no answer. Left VM for patient to return call.

## 2023-04-12 LAB — BASIC METABOLIC PANEL
BUN/Creatinine Ratio: 11 (ref 9–23)
BUN: 11 mg/dL (ref 6–20)
CO2: 22 mmol/L (ref 20–29)
Calcium: 9.2 mg/dL (ref 8.7–10.2)
Chloride: 105 mmol/L (ref 96–106)
Creatinine, Ser: 1.04 mg/dL — ABNORMAL HIGH (ref 0.57–1.00)
Glucose: 83 mg/dL (ref 70–99)
Potassium: 4.7 mmol/L (ref 3.5–5.2)
Sodium: 140 mmol/L (ref 134–144)
eGFR: 76 mL/min/{1.73_m2} (ref >59)

## 2023-04-12 LAB — CBC
Hematocrit: 38.2 % (ref 34.0–46.6)
Hemoglobin: 12.1 g/dL (ref 11.1–15.9)
MCH: 28.3 pg (ref 26.6–33.0)
MCHC: 31.7 g/dL (ref 31.5–35.7)
MCV: 89 fL (ref 79–97)
Platelets: 326 10*3/uL (ref 150–450)
RBC: 4.28 x10E6/uL (ref 3.77–5.28)
RDW: 15.1 % (ref 11.7–15.4)
WBC: 6 10*3/uL (ref 3.4–10.8)

## 2023-04-16 NOTE — Anesthesia Preprocedure Evaluation (Signed)
 Anesthesia Evaluation  Patient identified by MRN, date of birth, ID band Patient awake    Reviewed: Allergy & Precautions, H&P , NPO status , Patient's Chart, lab work & pertinent test results  Airway Mallampati: II  TM Distance: >3 FB Neck ROM: Full    Dental no notable dental hx.    Pulmonary neg pulmonary ROS   Pulmonary exam normal breath sounds clear to auscultation       Cardiovascular negative cardio ROS Normal cardiovascular exam Rhythm:Regular Rate:Normal  Wolf Parkinson White   Neuro/Psych negative neurological ROS  negative psych ROS   GI/Hepatic negative GI ROS, Neg liver ROS,,,  Endo/Other  negative endocrine ROS    Renal/GU negative Renal ROS  negative genitourinary   Musculoskeletal negative musculoskeletal ROS (+)    Abdominal   Peds negative pediatric ROS (+)  Hematology negative hematology ROS (+)   Anesthesia Other Findings   Reproductive/Obstetrics negative OB ROS                              Anesthesia Physical Anesthesia Plan  ASA: 2  Anesthesia Plan: MAC   Post-op Pain Management:    Induction: Intravenous  PONV Risk Score and Plan: 2 and Ondansetron and Treatment may vary due to age or medical condition  Airway Management Planned: Natural Airway and Simple Face Mask  Additional Equipment:   Intra-op Plan:   Post-operative Plan: Extubation in OR  Informed Consent: I have reviewed the patients History and Physical, chart, labs and discussed the procedure including the risks, benefits and alternatives for the proposed anesthesia with the patient or authorized representative who has indicated his/her understanding and acceptance.     Dental advisory given  Plan Discussed with: CRNA  Anesthesia Plan Comments:          Anesthesia Quick Evaluation

## 2023-04-16 NOTE — Pre-Procedure Instructions (Signed)
Attempted to call patient regarding procedure instructions.  Left voice mail on the following items: Arrival time 0530 Nothing to eat or drink after midnight No meds AM of procedure Responsible person to drive you home and stay with you for 24 hrs      

## 2023-04-17 ENCOUNTER — Ambulatory Visit (HOSPITAL_COMMUNITY): Payer: Self-pay | Admitting: Certified Registered"

## 2023-04-17 ENCOUNTER — Other Ambulatory Visit: Payer: Self-pay

## 2023-04-17 ENCOUNTER — Encounter (HOSPITAL_COMMUNITY)
Admission: RE | Disposition: A | Discharge status: Home / Self Care | Payer: Self-pay | Source: Home / Self Care | Attending: Internal Medicine

## 2023-04-17 ENCOUNTER — Encounter: Payer: Self-pay | Admitting: Student

## 2023-04-17 ENCOUNTER — Ambulatory Visit (HOSPITAL_COMMUNITY)
Admission: RE | Admit: 2023-04-17 | Discharge: 2023-04-17 | Disposition: A | Discharge status: Home / Self Care | Attending: Internal Medicine | Admitting: Internal Medicine

## 2023-04-17 DIAGNOSIS — I471 Supraventricular tachycardia, unspecified: Secondary | ICD-10-CM | POA: Diagnosis present

## 2023-04-17 DIAGNOSIS — R9431 Abnormal electrocardiogram [ECG] [EKG]: Secondary | ICD-10-CM

## 2023-04-17 DIAGNOSIS — I456 Pre-excitation syndrome: Secondary | ICD-10-CM | POA: Diagnosis not present

## 2023-04-17 HISTORY — PX: SVT ABLATION: EP1225

## 2023-04-17 LAB — PREGNANCY, URINE: Preg Test, Ur: NEGATIVE

## 2023-04-17 SURGERY — SVT ABLATION
Anesthesia: Monitor Anesthesia Care

## 2023-04-17 MED ORDER — SODIUM CHLORIDE 0.9% FLUSH: 3.0000 mL | Freq: Two times a day (BID) | INTRAVENOUS | Status: DC

## 2023-04-17 MED ORDER — PROPOFOL 10 MG/ML IV BOLUS
INTRAVENOUS | Status: DC | PRN
  Administered 2023-04-17: 20 mg via INTRAVENOUS

## 2023-04-17 MED ORDER — HEPARIN SODIUM (PORCINE) 1000 UNIT/ML IJ SOLN
INTRAMUSCULAR | Status: DC | PRN
  Administered 2023-04-17: 1000 [IU] via INTRAVENOUS

## 2023-04-17 MED ORDER — HEPARIN SODIUM (PORCINE) 1000 UNIT/ML IJ SOLN
INTRAMUSCULAR | Status: AC
Start: 2023-04-17 — End: ?
  Filled 2023-04-17: qty 10

## 2023-04-17 MED ORDER — ACETAMINOPHEN 325 MG PO TABS: 650.0000 mg | ORAL_TABLET | ORAL | Status: DC | PRN

## 2023-04-17 MED ORDER — SODIUM CHLORIDE 0.9% FLUSH: 3.0000 mL | INTRAVENOUS | Status: DC | PRN

## 2023-04-17 MED ORDER — MIDAZOLAM HCL 2 MG/2ML IJ SOLN
INTRAMUSCULAR | Status: DC | PRN
  Administered 2023-04-17: .5 mg via INTRAVENOUS
  Administered 2023-04-17: .25 mg via INTRAVENOUS

## 2023-04-17 MED ORDER — FENTANYL CITRATE (PF) 100 MCG/2ML IJ SOLN
INTRAMUSCULAR | Status: AC
Start: 2023-04-17 — End: ?
  Filled 2023-04-17: qty 2

## 2023-04-17 MED ORDER — PROPOFOL 500 MG/50ML IV EMUL
INTRAVENOUS | Status: DC | PRN
  Administered 2023-04-17: 25 ug/kg/min via INTRAVENOUS

## 2023-04-17 MED ORDER — ISOPROTERENOL HCL 0.2 MG/ML IJ SOLN
INTRAMUSCULAR | Status: AC
  Filled 2023-04-17: qty 5

## 2023-04-17 MED ORDER — SODIUM CHLORIDE 0.9 % IV SOLN: 250.0000 mL | INTRAVENOUS | Status: DC | PRN

## 2023-04-17 MED ORDER — MIDAZOLAM HCL 2 MG/2ML IJ SOLN
INTRAMUSCULAR | Status: AC
  Filled 2023-04-17: qty 2

## 2023-04-17 MED ORDER — BUPIVACAINE HCL (PF) 0.25 % IJ SOLN
INTRAMUSCULAR | Status: DC | PRN
  Administered 2023-04-17: 20 mL

## 2023-04-17 MED ORDER — BUPIVACAINE HCL (PF) 0.25 % IJ SOLN
INTRAMUSCULAR | Status: AC
Start: 2023-04-17 — End: ?
  Filled 2023-04-17: qty 30

## 2023-04-17 MED ORDER — FENTANYL CITRATE (PF) 100 MCG/2ML IJ SOLN
INTRAMUSCULAR | Status: DC | PRN
  Administered 2023-04-17: 25 ug via INTRAVENOUS
  Administered 2023-04-17: 12.5 ug via INTRAVENOUS

## 2023-04-17 MED ORDER — SODIUM CHLORIDE 0.9 % IV SOLN: INTRAVENOUS | Status: DC

## 2023-04-17 MED ORDER — ONDANSETRON HCL 4 MG/2ML IJ SOLN: 4.0000 mg | Freq: Four times a day (QID) | INTRAMUSCULAR | Status: DC | PRN

## 2023-04-17 MED ORDER — HEPARIN (PORCINE) IN NACL 1000-0.9 UT/500ML-% IV SOLN
INTRAVENOUS | Status: DC | PRN
  Administered 2023-04-17: 500 mL

## 2023-04-17 MED ORDER — ISOPROTERENOL HCL 0.2 MG/ML IJ SOLN
INTRAVENOUS | Status: DC | PRN
  Administered 2023-04-17: 4 ug/min via INTRAVENOUS

## 2023-04-17 MED ORDER — ONDANSETRON HCL 4 MG/2ML IJ SOLN
INTRAMUSCULAR | Status: DC | PRN
  Administered 2023-04-17: 4 mg via INTRAVENOUS

## 2023-04-17 SURGICAL SUPPLY — 12 items
BAG SNAP BAND KOVER 36X36 (MISCELLANEOUS) IMPLANT
CATH ABLAT QDOT MICRO BI TC DF (CATHETERS) IMPLANT
CATH JOSEPH QUAD ALLRED 6F REP (CATHETERS) IMPLANT
CATH POLARIS X 2.5/5/2.5 DECAP (CATHETERS) IMPLANT
CATH POLARIS X REPROCESSED (CATHETERS) IMPLANT
PACK EP LF (CUSTOM PROCEDURE TRAY) ×1 IMPLANT
PAD DEFIB RADIO PHYSIO CONN (PAD) ×1 IMPLANT
PATCH CARTO3 (PAD) IMPLANT
SHEATH PINNACLE 6F 10CM (SHEATH) IMPLANT
SHEATH PINNACLE 7F 10CM (SHEATH) IMPLANT
SHEATH PINNACLE 8F 10CM (SHEATH) IMPLANT
TUBING SMART ABLATE COOLFLOW (TUBING) IMPLANT

## 2023-04-17 NOTE — Anesthesia Postprocedure Evaluation (Signed)
 Anesthesia Post Note  Patient: Anna Cohen  Procedure(s) Performed: SVT ABLATION     Patient location during evaluation: PACU Anesthesia Type: MAC Level of consciousness: awake and alert Pain management: pain level controlled Vital Signs Assessment: post-procedure vital signs reviewed and stable Respiratory status: spontaneous breathing, nonlabored ventilation, respiratory function stable and patient connected to nasal cannula oxygen Cardiovascular status: stable and blood pressure returned to baseline Postop Assessment: no apparent nausea or vomiting Anesthetic complications: no   There were no known notable events for this encounter.  Last Vitals:  Vitals:   04/17/23 1230 04/17/23 1300  BP: (!) 99/56 (!) 100/59  Pulse: (!) 55 (!) 51  Resp: 15 14  Temp:    SpO2: 100% 100%    Last Pain:  Vitals:   04/17/23 1055  TempSrc:   PainSc: 0-No pain                 [name]

## 2023-04-17 NOTE — Interval H&P Note (Signed)
 History and Physical Interval Note:  04/17/2023 7:27 AM  Anna Cohen  has presented today for surgery, with the diagnosis of SVT-WPW.  The various methods of treatment have been discussed with the patient and family. After consideration of risks, benefits and other options for treatment, the patient has consented to  Procedure(s): SVT ABLATION (N/A) as a surgical intervention.  The patient's history has been reviewed, patient examined, no change in status, stable for surgery.  I have reviewed the patient's chart and labs.  Questions were answered to the patient's satisfaction.     Lewayne Bunting

## 2023-04-17 NOTE — Progress Notes (Signed)
 Site area: Right groin a 6, 7 , 8 venous sheath was removed  Site Prior to Removal:  Level 0  Pressure Applied For 20 MINUTES    Bedrest Beginning at 1020am X 6 hours  Manual:   Yes.    Patient Status During Pull:  stable  Post Pull Groin Site:  Level 0  Post Pull Instructions Given:  Yes.    Post Pull Pulses Present:  Yes.    Dressing Applied:  Yes.    Comments:

## 2023-04-17 NOTE — Progress Notes (Signed)
 Patient procedural site is intact, ambulated and voided without complication. Patient is ready for discharge.

## 2023-04-17 NOTE — Transfer of Care (Signed)
 Immediate Anesthesia Transfer of Care Note  Patient: Anna Cohen  Procedure(s) Performed: SVT ABLATION  Patient Location: PACU  Anesthesia Type:MAC  Level of Consciousness: awake, alert , and oriented  Airway & Oxygen Therapy: Patient Spontanous Breathing  Post-op Assessment: Report given to RN  Post vital signs: Reviewed and stable  Last Vitals:  Vitals Value Taken Time  BP 99/63 04/17/23 0945  Temp    Pulse 52 04/17/23 0950  Resp 15 04/17/23 0950  SpO2 100 % 04/17/23 0950  Vitals shown include unfiled device data.  Last Pain:  Vitals:   04/17/23 0628  TempSrc:   PainSc: 0-No pain         Complications: There were no known notable events for this encounter.

## 2023-04-17 NOTE — Discharge Instructions (Signed)

## 2023-04-18 ENCOUNTER — Telehealth (HOSPITAL_COMMUNITY): Payer: Self-pay

## 2023-04-18 ENCOUNTER — Ambulatory Visit (HOSPITAL_COMMUNITY): Payer: 59

## 2023-04-18 ENCOUNTER — Encounter (HOSPITAL_COMMUNITY): Payer: Self-pay | Admitting: Internal Medicine

## 2023-04-18 NOTE — Telephone Encounter (Signed)
 Spoke with patient to complete post procedure follow up call.  Patient reports no complications with groin sites.   Instructions reviewed with patient:  Remove large bandage at puncture site after 24 hours. It is normal to have bruising, tenderness and a pea or marble sized lump/knot at the groin site which can take up to three months to resolve.  Get help right away if you notice sudden swelling at the puncture site.  Check your puncture site every day for signs of infection: fever, redness, swelling, pus drainage, warmth, foul odor or excessive pain. If this occurs, please call the office at [phone], to speak with the nurse. Get help right away if your puncture site is bleeding and the bleeding does not stop after applying firm pressure to the area.  You may continue to have skipped beats during the first several months after your procedure.  You will follow up with Dr.Gregg Ladona Ridgel on 05/15/23.    Patient verbalized understanding to all instructions provided.

## 2023-04-28 ENCOUNTER — Ambulatory Visit: Admitting: Internal Medicine

## 2023-05-14 ENCOUNTER — Ambulatory Visit: Admitting: Student

## 2023-05-14 ENCOUNTER — Encounter: Payer: Self-pay | Admitting: Student

## 2023-05-14 VITALS — BP 112/62 | HR 76 | Ht 63.0 in | Wt 127.6 lb

## 2023-05-14 DIAGNOSIS — Z7184 Encounter for health counseling related to travel: Secondary | ICD-10-CM | POA: Diagnosis not present

## 2023-05-14 DIAGNOSIS — Z2989 Encounter for other specified prophylactic measures: Secondary | ICD-10-CM

## 2023-05-14 DIAGNOSIS — A6000 Herpesviral infection of urogenital system, unspecified: Secondary | ICD-10-CM | POA: Diagnosis not present

## 2023-05-14 MED ORDER — VALACYCLOVIR HCL 500 MG PO TABS: 500.0000 mg | ORAL_TABLET | Freq: Two times a day (BID) | ORAL | 0 refills | Status: AC

## 2023-05-14 MED ORDER — ATOVAQUONE-PROGUANIL HCL 250-100 MG PO TABS: 1.0000 | ORAL_TABLET | Freq: Every day | ORAL | 0 refills | Status: AC

## 2023-05-14 NOTE — Patient Instructions (Addendum)
  It was great to see you! Thank you for allowing me to participate in your care!   I recommend that you always bring your medications to each appointment as this makes it easy to ensure we are on the correct medications and helps us  not miss when refills are needed.  Our plans for today:  - Take 1 tablet of MALARONE daily (atovaquone proguanil).  Start taking this medication 2 days before you leave the country.  Please take every day while you are in [place].  Please take for 7 additional days when you come back to [place] .  This is for the prevention of malaria.  You will need to get typhoid vaccine and yellow fever vaccine. These can be done at:  [facility] Department: [address] [place] Phone: [phone]  [facility] Address: [address], [place], [place] [address] Phone: [phone]    Take care and seek immediate care sooner if you develop any concerns. Please remember to show up 15 minutes before your scheduled appointment time!  Lavada Porteous, DO [facility]

## 2023-05-14 NOTE — Progress Notes (Signed)
    SUBJECTIVE:   CHIEF COMPLAINT / HPI:   Travel to foreign country  HSV-2  Patient is traveling to [place].  She has appointment with department to receive her typhoid and yellow fever vaccine.  She presents today to discuss strategies to reduce traveler's diarrhea-we counseled on freshwater, cooked foods, OTC Pepto-Bismol.  Additionally she presents to obtain malaria prophylaxis.  Patient has HSV-2, she needs a refill of her Valtrex.  OBJECTIVE:   BP 112/62   Pulse 76   Ht 5\' 3"  (1.6 m)   Wt 127 lb 9.6 oz (57.9 kg)   LMP 03/21/2023 (Exact Date)   SpO2 99%   BMI 22.60 kg/m    General: NAD, pleasant Cardio: RRR, no MRG. Cap Refill <2s. Respiratory: CTAB, normal wob on RA Skin: Warm and dry  ASSESSMENT/PLAN:   Assessment & Plan Recurrent genital HSV (herpes simplex virus) infection Not on prophylaxis. Takes during flares. No current flare, but needs refill prior to trip. -Refill Valtrex 500 mg twice daily for 3 days during flare Travel advice encounter Travel to [place]. - Yellow fever, typhoid vaccine at health department - Malarone 250-100 mg daily, to start 2 days prior to departure, during trip, and 7 days after return. - Discussed reduce to reduce risk of traveler's diarrhea  Lavada Porteous, DO [facility]

## 2023-05-15 ENCOUNTER — Encounter: Payer: Self-pay | Admitting: Internal Medicine

## 2023-05-15 ENCOUNTER — Ambulatory Visit: Attending: Internal Medicine | Admitting: Internal Medicine

## 2023-05-15 VITALS — BP 110/68 | HR 62 | Ht 63.0 in | Wt 124.6 lb

## 2023-05-15 DIAGNOSIS — I471 Supraventricular tachycardia, unspecified: Secondary | ICD-10-CM

## 2023-05-15 NOTE — Patient Instructions (Addendum)
 Medication Instructions:  Your physician recommends that you continue on your current medications as directed. Please refer to the Current Medication list given to you today.  *If you need a refill on your cardiac medications before your next appointment, please call your pharmacy*  Lab Work: None ordered.  You may go to any [facility] Location for your lab work:  [facility] - [address] ([facility]) - [address] [phone] [address]  [place] - [address]   [place]  - [address]   [place] - [address] - [address] [employer]  - [address] [place] - [address] ([facility]   If you have labs (blood work) drawn today and your tests are completely normal, you will receive your results only by: [employer] (if you have MyChart)  If you have any lab test that is abnormal or we need to change your treatment, we will call you or send a MyChart message to review the results.  Testing/Procedures: None ordered.  Follow-Up: At [facility], you and your health needs are our priority.  As part of our continuing mission to provide you with exceptional heart care, we have created designated Provider Care Teams.  These Care Teams include your primary Cardiologist (physician) and Advanced Practice Providers (APPs -  Physician Assistants and Nurse Practitioners) who all work together to provide you with the care you need, when you need it.  We recommend signing up for the patient portal called "MyChart".  Sign up information is provided on this After Visit Summary.  MyChart is used to connect with patients for Virtual Visits (Telemedicine).  Patients are able to view lab/test results, encounter notes, upcoming appointments, etc.  Non-urgent messages can be sent to your provider as well.   To learn more about what you can do with MyChart, go to  [email or web address].    Your next appointment:   As needed  The format for your next appointment:   In Person  Provider:   Lewayne Bunting, MD{or one of the following Advanced Practice Providers on your designated Care Team:   Francis Dowse, [place] Casimiro Needle "Mardelle Matte" [place], [place] Earnest Rosier, NP  Note: Remote monitoring is used to monitor your Pacemaker/ ICD from home. This monitoring reduces the number of office visits required to check your device to one time per year. It allows [place] to keep an eye on the functioning of your device to ensure it is working properly.            Valet parking services will be available as well.

## 2023-05-15 NOTE — Progress Notes (Signed)
 HPI Ms. Anna Cohen returns today for followup. 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 At her EP study she did not have inducible SVT but was found to have a very rapidly conducting right anterior AP which was successfully ablated. She has done well in the interim though she has not started back exercising.  No Known Allergies   Current Outpatient Medications  Medication Sig Dispense Refill   atovaquone-proguanil (MALARONE) 250-100 MG TABS tablet Take 1 tablet by mouth daily. 39 tablet 0   Multiple Vitamins-Minerals (MULTIVITAMIN WITH MINERALS) tablet Take 2 tablets by mouth daily. Gummy     Propylene Glycol (LUBRICANT EYE DROP OP) Place 1 drop into both eyes daily as needed (eye lubrication).     valACYclovir (VALTREX) 500 MG tablet Take 1 tablet (500 mg total) by mouth 2 (two) times daily. Take for 3 days within 24 hours of symptom onset. 36 tablet 0   No current facility-administered medications for this visit.     History reviewed. No pertinent past medical history.  ROS:   All systems reviewed and negative except as noted in the HPI.   Past Surgical History:  Procedure Laterality Date   EYE SURGERY     SVT ABLATION N/A 04/17/2023   Procedure: SVT ABLATION;  Surgeon: Marinus Maw, MD;  Location: [facility] INVASIVE CV LAB;  Service: Cardiovascular;  Laterality: N/A;     Family History  Problem Relation Age of Onset   Healthy Mother    Healthy Father      Social History   Socioeconomic History   Marital status: Married    Spouse name: Not on file   Number of children: Not on file   Years of education: Not on file   Highest education level: Master's degree (e.g., MA, MS, MEng, MEd, MSW, MBA)  Occupational History   Not on file  Tobacco Use   Smoking status: Never   Smokeless tobacco: Never  Vaping Use   Vaping status: Never Used  Substance and Sexual Activity   Alcohol use:  No   Drug use: Never   Sexual activity: Yes    Comment: "Plan B"  Other Topics Concern   Not on file  Social History Narrative   Not on file   Social Drivers of Health   Financial Resource Strain: Low Risk  (05/13/2023)   Overall Financial Resource Strain (CARDIA)    Difficulty of Paying Living Expenses: Not hard at all  Food Insecurity: No Food Insecurity (05/13/2023)   Hunger Vital Sign    Worried About Running Out of Food in the Last Year: Never true    Ran Out of Food in the Last Year: Never true  Transportation Needs: No Transportation Needs (05/13/2023)   PRAPARE - Administrator, Civil Service (Medical): No    Lack of Transportation (Non-Medical): No  Physical Activity: Sufficiently Active (05/13/2023)   Exercise Vital Sign    Days of Exercise per Week: 3 days    Minutes of Exercise per Session: 60 min  Stress: No Stress Concern Present (05/13/2023)   Anna [facility] [employer] Occupational [employer] - Occupational Stress Questionnaire    Feeling of Stress : Not at all  Social Connections: Moderately Integrated (05/13/2023)   Social Connection and Isolation Panel [NHANES]    Frequency of Communication with Friends and Family: Three times a week    Frequency  of Social Gatherings with Friends and Family: Once a week    Attends Religious Services: More than 4 times per year    Active Member of [employer] or Organizations: No    Attends Engineer, structural: Not on file    Marital Status: Married  Intimate Partner Violence: Unknown (05/04/2021)   Received from [employer], [facility]   HITS    Physically Hurt: Not on file    Insult or Talk Down To: Not on file    Threaten Physical Harm: Not on file    Scream or Curse: Not on file     BP 110/68   Pulse 62   Ht 5\' 3"  (1.6 m)   Wt 124 lb 9.6 oz (56.5 kg)   LMP 03/21/2023 (Exact Date)   SpO2 99%   BMI 22.07 kg/m   Physical Exam:  Well appearing young woman, NAD HEENT: Unremarkable Neck:  No JVD, no  thyromegally Lymphatics:  No adenopathy Back:  No CVA tenderness Lungs:  Clear with no wheezes HEART:  Regular rate rhythm, no murmurs, no rubs, no clicks Abd:  soft, positive bowel sounds, no organomegally, no rebound, no guarding Ext:  2 plus pulses, no edema, no cyanosis, no clubbing Skin:  No rashes no nodules Neuro:  CN II through XII intact, motor grossly intact  EKG - nsr with a short PR, no pre-excitation.  Assess/Plan: WPW - she is s/p successful ablation with resolution of AP conduction in less than 2 seconds from the intiation of RF energy. She has not had any SVT. She does not have any residual pre-excitation. She may return to all activities with no limitation. She will call us  if she were to redevelop any symptoms.  Pete Brand Felina Tello,MD

## 2023-12-28 ENCOUNTER — Encounter (HOSPITAL_COMMUNITY): Payer: Self-pay

## 2023-12-28 ENCOUNTER — Ambulatory Visit (HOSPITAL_COMMUNITY)
Admission: EM | Admit: 2023-12-28 | Discharge: 2023-12-28 | Disposition: A | Discharge status: Home / Self Care | Attending: Emergency Medicine | Admitting: Emergency Medicine

## 2023-12-28 DIAGNOSIS — K5900 Constipation, unspecified: Secondary | ICD-10-CM

## 2023-12-28 LAB — POCT URINE DIPSTICK
Bilirubin, UA: NEGATIVE
Blood, UA: NEGATIVE
Glucose, UA: NEGATIVE mg/dL
Ketones, POC UA: NEGATIVE mg/dL
Leukocytes, UA: NEGATIVE
Nitrite, UA: NEGATIVE
POC PROTEIN,UA: NEGATIVE
Spec Grav, UA: 1.015 (ref 1.010–1.025)
Urobilinogen, UA: 1 U/dL
pH, UA: 7 (ref 5.0–8.0)

## 2023-12-28 LAB — POCT URINE PREGNANCY: Preg Test, Ur: NEGATIVE

## 2023-12-28 MED ORDER — POLYETHYLENE GLYCOL 4500 POWD: 0 refills | Status: DC

## 2023-12-28 NOTE — ED Provider Notes (Signed)
 [facility]    CSN: [id number] Arrival date & time: 12/28/23  1528      History   Chief Complaint Chief Complaint  Patient presents with   Abdominal Pain    HPI Anna Cohen is a 28 y.o. female.  Yesterday developed abdominal pain in the left lower quadrant It has actually improved today, now 3/10 Initially during triage reported dysuria, however denies this on my exam, states pressure in lower abdomen was only during bowel movement. She had to strain/push with BM yesterday and it was hard. No blood. No BM yet today.    Denies nausea or vomiting. No fevers No vaginal symptoms  LMP 11/9  Had to miss work today from discomfort, needs note   History reviewed. No pertinent past medical history.  Patient Active Problem List   Diagnosis Date Noted   Abnormal EKG 01/12/2023   Abnormal facial hair 04/16/2019   Hyperpigmentation of skin 04/16/2019    Past Surgical History:  Procedure Laterality Date   EYE SURGERY     SVT ABLATION N/A 04/17/2023   Procedure: SVT ABLATION;  Surgeon: Waddell Danelle ORN, MD;  Location: [facility] INVASIVE CV LAB;  Service: Cardiovascular;  Laterality: N/A;    OB History   No obstetric history on file.      Home Medications    Prior to Admission medications   Medication Sig Start Date End Date Taking? Authorizing Provider  Polyethylene Glycol 4500 POWD Dissolve 1 capful of powder in one 8 ounce glass of water. Repeat daily or as needed to soften stool 12/28/23  Yes Rockell Faulks, [place], PA-C  atovaquone -proguanil (MALARONE ) 250-100 MG TABS tablet Take 1 tablet by mouth daily. 05/14/23   Howell Lunger, DO  Multiple Vitamins-Minerals (MULTIVITAMIN WITH MINERALS) tablet Take 2 tablets by mouth daily. Gummy    [provider]  Propylene Glycol (LUBRICANT EYE DROP OP) Place 1 drop into both eyes daily as needed (eye lubrication).    [provider]  valACYclovir  (VALTREX ) 500 MG tablet Take 1 tablet (500 mg total) by mouth 2  (two) times daily. Take for 3 days within 24 hours of symptom onset. 05/14/23   Howell Lunger, DO    Family History Family History  Problem Relation Age of Onset   Healthy Mother    Healthy Father     Social History Social History   Tobacco Use   Smoking status: Never   Smokeless tobacco: Never  Vaping Use   Vaping status: Never Used  Substance Use Topics   Alcohol use: No   Drug use: Never     Allergies   Patient has no known allergies.   Review of Systems Review of Systems  Gastrointestinal:  Positive for abdominal pain.   As per HPI  Physical Exam Triage Vital Signs ED Triage Vitals  Encounter Vitals Group     BP 12/28/23 1619 122/66     Girls Systolic BP Percentile --      Girls Diastolic BP Percentile --      Boys Systolic BP Percentile --      Boys Diastolic BP Percentile --      Pulse Rate 12/28/23 1619 69     Resp 12/28/23 1619 16     Temp 12/28/23 1619 97.8 F (36.6 C)     Temp Source 12/28/23 1619 Oral     SpO2 12/28/23 1619 98 %     Weight 12/28/23 1619 123 lb (55.8 kg)     Height 12/28/23 1619 5' 3 (  1.6 m)     Head Circumference --      Peak Flow --      Pain Score 12/28/23 1618 3     Pain Loc --      Pain Education --      Exclude from Growth Chart --    No data found.  Updated Vital Signs BP 122/66 (BP Location: Left Arm)   Pulse 69   Temp 97.8 F (36.6 C) (Oral)   Resp 16   Ht 5' 3 (1.6 m)   Wt 123 lb (55.8 kg)   LMP 12/07/2023 (Approximate)   SpO2 98%   BMI 21.79 kg/m    Physical Exam Vitals and nursing note reviewed.  Constitutional:      Appearance: Normal appearance.  HENT:     Mouth/Throat:     Mouth: Mucous membranes are moist.     Pharynx: Oropharynx is clear.  Eyes:     Conjunctiva/sclera: Conjunctivae normal.  Cardiovascular:     Rate and Rhythm: Normal rate and regular rhythm.     Heart sounds: Normal heart sounds.  Pulmonary:     Effort: Pulmonary effort is normal. No respiratory distress.     Breath  sounds: Normal breath sounds.  Abdominal:     General: Bowel sounds are normal.     Palpations: Abdomen is soft.     Tenderness: There is no abdominal tenderness. There is no right CVA tenderness, left CVA tenderness, guarding or rebound.  Musculoskeletal:        General: Normal range of motion.  Skin:    General: Skin is warm and dry.  Neurological:     Mental Status: She is alert and oriented to person, place, and time.     UC Treatments / Results  Labs (all labs ordered are listed, but only abnormal results are displayed) Labs Reviewed  POCT URINE DIPSTICK  POCT URINE PREGNANCY    EKG  Radiology No results found.  Procedures Procedures  Medications Ordered in UC Medications - No data to display  Initial Impression / Assessment and Plan / UC Course  I have reviewed the triage vital signs and the nursing notes.  Pertinent labs & imaging results that were available during my care of the patient were reviewed by me and considered in my medical decision making (see chart for details).  UPT negative  UA negative Reassuring abdominal pain has already improved today Non tender on exam Recommend treat for constipation. Increase water, fiber, try miralax.  Monitor symptoms. Return and ED precautions   Follow with PCP and ob/gyn  Final Clinical Impressions(s) / UC Diagnoses   Final diagnoses:  Constipation, unspecified constipation type     Discharge Instructions      MiraLAX cleanse out (polyethylene glycol) for constipation Dissolve 1 capful of powder in 1 glass of water. This is one dose I recommend to use this once daily for 2-3 days in a row, and see if your symptoms improve  Please go to the emergency department if symptoms worsen.  Call ob/gyn to get an appointment set up     ED Prescriptions     Medication Sig Dispense Auth. Provider   Polyethylene Glycol 4500 POWD Dissolve 1 capful of powder in one 8 ounce glass of water. Repeat daily or as  needed to soften stool 500 g Kerby Hockley, [place], PA-C      PDMP not reviewed this encounter.   Aloise Copus, [place], PA-C 12/28/23 1708

## 2023-12-28 NOTE — Discharge Instructions (Addendum)
 MiraLAX cleanse out (polyethylene glycol) for constipation Dissolve 1 capful of powder in 1 glass of water. This is one dose I recommend to use this once daily for 2-3 days in a row, and see if your symptoms improve  Please go to the emergency department if symptoms worsen.  Call ob/gyn to get an appointment set up

## 2023-12-28 NOTE — ED Triage Notes (Signed)
 Onset early morning yesterday with left lower quadrant abdominal pain. States the pain is slightly better today. Denies NVD. Patient having dysuria and frequency. Denies any vaginal symptoms.   States no new foods, meds, or sick exposure.

## 2024-02-18 ENCOUNTER — Ambulatory Visit: Payer: Self-pay | Admitting: Family Medicine

## 2024-02-18 ENCOUNTER — Encounter: Payer: Self-pay | Admitting: Family Medicine

## 2024-02-18 VITALS — BP 122/66 | HR 60 | Ht 63.0 in | Wt 125.2 lb

## 2024-02-18 DIAGNOSIS — L989 Disorder of the skin and subcutaneous tissue, unspecified: Secondary | ICD-10-CM | POA: Diagnosis not present

## 2024-02-18 DIAGNOSIS — N942 Vaginismus: Secondary | ICD-10-CM

## 2024-02-18 DIAGNOSIS — Z13220 Encounter for screening for lipoid disorders: Secondary | ICD-10-CM | POA: Diagnosis not present

## 2024-02-18 DIAGNOSIS — Z0000 Encounter for general adult medical examination without abnormal findings: Secondary | ICD-10-CM

## 2024-02-18 NOTE — Patient Instructions (Signed)
 It was wonderful to see you today.  Please bring ALL of your medications with you to every visit.   Vaginismus - I believe this is caused by multiple factors. I recommend speaking with a mental health therapist. Look on psychology [email or web address] and put in your insurance. I have also referred you to pelvic floor PT. They will call to schedule you. You can also try lubricant.  Also start prenatal vitamins. It can take up to a year to conceive. You can try to time sexual intercourse with ovulation.   For your dry patch I will refer you to our dermatology clinic within our family medicine clinic.   Thank you for choosing [facility].   Please call [phone] with any questions about today's appointment.  Areta Saliva, MD  Family Medicine
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
## 2024-02-19 ENCOUNTER — Ambulatory Visit: Payer: Self-pay | Admitting: Family Medicine

## 2024-02-19 LAB — LIPID PANEL
Chol/HDL Ratio: 2.6 ratio (ref 0.0–4.4)
Cholesterol, Total: 120 mg/dL (ref 100–199)
HDL: 46 mg/dL
LDL Chol Calc (NIH): 65 mg/dL (ref 0–99)
Triglycerides: 31 mg/dL (ref 0–149)
VLDL Cholesterol Cal: 9 mg/dL (ref 5–40)
